# Patient Record
Sex: Male | Born: 1975 | Race: Black or African American | Hispanic: No | Marital: Single | Smoking: Never smoker
Health system: Southern US, Community
[De-identification: ages and names within clinical notes are randomized; demographics above are authoritative.]

---

## 2013-12-31 ENCOUNTER — Encounter (HOSPITAL_COMMUNITY): Payer: Self-pay | Admitting: Emergency Medicine

## 2013-12-31 ENCOUNTER — Emergency Department (HOSPITAL_COMMUNITY)
Admission: EM | Admit: 2013-12-31 | Discharge: 2013-12-31 | Disposition: A | Discharge status: Home / Self Care | Payer: Self-pay | Attending: Emergency Medicine | Admitting: Emergency Medicine

## 2013-12-31 DIAGNOSIS — J309 Allergic rhinitis, unspecified: Secondary | ICD-10-CM | POA: Insufficient documentation

## 2013-12-31 DIAGNOSIS — T4995XA Adverse effect of unspecified topical agent, initial encounter: Secondary | ICD-10-CM | POA: Insufficient documentation

## 2013-12-31 DIAGNOSIS — T7840XA Allergy, unspecified, initial encounter: Secondary | ICD-10-CM

## 2013-12-31 DIAGNOSIS — L500 Allergic urticaria: Secondary | ICD-10-CM | POA: Insufficient documentation

## 2013-12-31 MED ORDER — FAMOTIDINE 20 MG PO TABS: 20.0000 mg | ORAL_TABLET | Freq: Two times a day (BID) | ORAL | Status: DC

## 2013-12-31 MED ORDER — PREDNISONE 20 MG PO TABS
60.0000 mg | ORAL_TABLET | Freq: Once | ORAL | Status: AC
Start: 2013-12-31 — End: 2013-12-31
  Administered 2013-12-31: 60 mg via ORAL
  Filled 2013-12-31: qty 3

## 2013-12-31 MED ORDER — DIPHENHYDRAMINE HCL 25 MG PO TABS: 25.0000 mg | ORAL_TABLET | Freq: Four times a day (QID) | ORAL | Status: DC

## 2013-12-31 NOTE — ED Provider Notes (Signed)
  Medical screening examination/treatment/procedure(s) were performed by non-physician practitioner and as supervising physician I was immediately available for consultation/collaboration.   EKG Interpretation None         Ladarren Steiner, MD 12/31/13 1643 

## 2013-12-31 NOTE — Discharge Planning (Signed)
[facility] Liaison was not able to see the patient, [facility] orange card information and resources will be mailed to the address listed. ° °

## 2013-12-31 NOTE — Discharge Instructions (Signed)
Your rash/hives and lip swelling may be related to allergic reaction to the new soap or even ox tail that you ate recently.  Avoid ox tail and switch back to your old soap.  Take benadryl and pepcid as prescribed.  Follow instruction below.    Allergies Allergies may happen from anything your body is sensitive to. This may be food, medicines, pollens, chemicals, and nearly anything around you in everyday life that produces allergens. An allergen is anything that causes an allergy producing substance. Heredity is often a factor in causing these problems. This means you may have some of the same allergies as your parents. Food allergies happen in all age groups. Food allergies are some of the most severe and life threatening. Some common food allergies are cow's milk, seafood, eggs, nuts, wheat, and soybeans. SYMPTOMS   Swelling around the mouth.  An itchy red rash or hives.  Vomiting or diarrhea.  Difficulty breathing. SEVERE ALLERGIC REACTIONS ARE LIFE-THREATENING. This reaction is called anaphylaxis. It can cause the mouth and throat to swell and cause difficulty with breathing and swallowing. In severe reactions only a trace amount of food (for example, peanut oil in a salad) may cause death within seconds. Seasonal allergies occur in all age groups. These are seasonal because they usually occur during the same season every year. They may be a reaction to molds, grass pollens, or tree pollens. Other causes of problems are house dust mite allergens, pet dander, and mold spores. The symptoms often consist of nasal congestion, a runny itchy nose associated with sneezing, and tearing itchy eyes. There is often an associated itching of the mouth and ears. The problems happen when you come in contact with pollens and other allergens. Allergens are the particles in the air that the body reacts to with an allergic reaction. This causes you to release allergic antibodies. Through a chain of events, these  eventually cause you to release histamine into the blood stream. Although it is meant to be protective to the body, it is this release that causes your discomfort. This is why you were given anti-histamines to feel better. If you are unable to pinpoint the offending allergen, it may be determined by skin or blood testing. Allergies cannot be cured but can be controlled with medicine. Hay fever is a collection of all or some of the seasonal allergy problems. It may often be treated with simple over-the-counter medicine such as diphenhydramine. Take medicine as directed. Do not drink alcohol or drive while taking this medicine. Check with your caregiver or package insert for child dosages. If these medicines are not effective, there are many new medicines your caregiver can prescribe. Stronger medicine such as nasal spray, eye drops, and corticosteroids may be used if the first things you try do not work well. Other treatments such as immunotherapy or desensitizing injections can be used if all else fails. Follow up with your caregiver if problems continue. These seasonal allergies are usually not life threatening. They are generally more of a nuisance that can often be handled using medicine. HOME CARE INSTRUCTIONS   If unsure what causes a reaction, keep a diary of foods eaten and symptoms that follow. Avoid foods that cause reactions.  If hives or rash are present:  Take medicine as directed.  You may use an over-the-counter antihistamine (diphenhydramine) for hives and itching as needed.  Apply cold compresses (cloths) to the skin or take baths in cool water. Avoid hot baths or showers. Heat will make a  rash and itching worse.  If you are severely allergic:  Following a treatment for a severe reaction, hospitalization is often required for closer follow-up.  Wear a medic-alert bracelet or necklace stating the allergy.  You and your family must learn how to give adrenaline or use an  anaphylaxis kit.  If you have had a severe reaction, always carry your anaphylaxis kit or EpiPen with you. Use this medicine as directed by your caregiver if a severe reaction is occurring. Failure to do so could have a fatal outcome. SEEK MEDICAL CARE IF:  You suspect a food allergy. Symptoms generally happen within 30 minutes of eating a food.  Your symptoms have not gone away within 2 days or are getting worse.  You develop new symptoms.  You want to retest yourself or your child with a food or drink you think causes an allergic reaction. Never do this if an anaphylactic reaction to that food or drink has happened before. Only do this under the care of a caregiver. SEEK IMMEDIATE MEDICAL CARE IF:   You have difficulty breathing, are wheezing, or have a tight feeling in your chest or throat.  You have a swollen mouth, or you have hives, swelling, or itching all over your body.  You have had a severe reaction that has responded to your anaphylaxis kit or an EpiPen. These reactions may return when the medicine has worn off. These reactions should be considered life threatening. MAKE SURE YOU:   Understand these instructions.  Will watch your condition.  Will get help right away if you are not doing well or get worse. Document Released: 10/24/2002 Document Revised: 11/25/2012 Document Reviewed: 03/30/2008 [facility] Patient Information 2014 [place].

## 2013-12-31 NOTE — ED Notes (Signed)
Pt is here with lip swelling, no airway problems, hives over body.  Pt states lips started swelling last nite.  Hives been there for three days.  No bp medications

## 2013-12-31 NOTE — ED Provider Notes (Signed)
CSN: [id number]     Arrival date & time 12/31/13  [id number] History   First MD Initiated Contact with Patient 12/31/13 1002    This chart was scribed for Fayrene HelperBowie Magalie Almon PA-C, a non-physician practitioner working with Gerhard Munchobert Lockwood, MD by Lewanda RifeAlexandra Hurtado, ED Scribe. This patient was seen in room TR08C/TR08C and the patient's care was started at 10:03 AM     Chief Complaint  Patient presents with  . Oral Swelling  . Rash    hives     (Consider location/radiation/quality/duration/timing/severity/associated sxs/prior Treatment) The history is provided by the patient. No language interpreter was used.   HPI Comments: Genelle BalRicky Forcier is a 38 y.o. male who presents to the Emergency Department with PMHx of seasonal allergies complaining of intermittent upper lip with alternating lower lip swelling onset last week. Describes symptoms as mild in severity. Reports trying Ox-tail last week for the first time. Reports also changing soap bar last week. Reports associated diffuse pruritic rash on upper torso. Denies trying any antihistamines or any other medications. Denies any aggravating factors. Denies changing soaps, detergent, new pets, new medications, wheeze, tongue swelling, sore throat, difficulty swallowing, and difficulty breathing. History reviewed. No pertinent past medical history. History reviewed. No pertinent past surgical history. No family history on file. History  Substance Use Topics  . Smoking status: Never Smoker   . Smokeless tobacco: Not on file  . Alcohol Use: No    Review of Systems  Constitutional: Negative for fever.  HENT: Negative for sore throat and trouble swallowing.   Respiratory: Negative for shortness of breath.   Skin: Positive for rash.  Allergic/Immunologic: Positive for environmental allergies.      Allergies  Other  Home Medications   Prior to Admission medications   Not on File   BP 118/77  Pulse 82  Temp(Src) 98.3 F (36.8 C) (Oral)  Resp 20   Wt 191 lb 5 oz (86.779 kg)  SpO2 100% Physical Exam  Nursing note and vitals reviewed. Constitutional: He is oriented to person, place, and time. He appears well-developed and well-nourished. No distress.  HENT:  Head: Normocephalic and atraumatic.  Mouth/Throat: Uvula is midline, oropharynx is clear and moist and mucous membranes are normal. No trismus in the jaw. No uvula swelling. No oropharyngeal exudate, posterior oropharyngeal edema or posterior oropharyngeal erythema.      Eyes: EOM are normal.  Neck: Neck supple. No tracheal deviation present.  Cardiovascular: Normal rate.   Pulmonary/Chest: Effort normal and breath sounds normal. No respiratory distress. He has no wheezes. He has no rales.  Musculoskeletal: Normal range of motion.  Neurological: He is alert and oriented to person, place, and time.  Skin: Skin is warm and dry. Rash noted. Rash is urticarial.  Diffuse urticarial rash involving trunk, bilateral arms, and neck area. No drainage. Appears non-infected.   Psychiatric: He has a normal mood and affect. His behavior is normal.    ED Course  Procedures (including critical care time) COORDINATION OF CARE:  Nursing notes reviewed. Vital signs reviewed. Initial pt interview and examination performed.   Filed Vitals:   12/31/13 0957  BP: 118/77  Pulse: 82  Temp: 98.3 F (36.8 C)  TempSrc: Oral  Resp: 20  Weight: 191 lb 5 oz (86.779 kg)  SpO2: 100%    10:06 AM-Discussed work up plan with pt at bedside, which includes one time dose of prednisone along with benadryl/pepcid.  Allergic reaction likely from new soap.  No airway compromise and no other concerning finding.  Pt agrees with plan.   Treatment plan initiated: Medications  predniSONE (DELTASONE) tablet 60 mg (not administered)     Initial diagnostic testing ordered.      Labs Review Labs Reviewed - No data to display  Imaging Review No results found.   EKG Interpretation None      MDM    Final diagnoses:  Allergic reaction    BP 118/77  Pulse 82  Temp(Src) 98.3 F (36.8 C) (Oral)  Resp 20  Wt 191 lb 5 oz (86.779 kg)  SpO2 100%  I personally performed the services described in this documentation, which was scribed in my presence. The recorded information has been reviewed and is accurate.     Fayrene HelperBowie Elener Custodio, PA-C 12/31/13 1014

## 2014-05-21 ENCOUNTER — Emergency Department (HOSPITAL_COMMUNITY)
Admission: EM | Admit: 2014-05-21 | Discharge: 2014-05-21 | Disposition: A | Discharge status: Home / Self Care | Payer: Self-pay | Attending: Emergency Medicine | Admitting: Emergency Medicine

## 2014-05-21 ENCOUNTER — Encounter (HOSPITAL_COMMUNITY): Payer: Self-pay | Admitting: Emergency Medicine

## 2014-05-21 DIAGNOSIS — Z79899 Other long term (current) drug therapy: Secondary | ICD-10-CM | POA: Insufficient documentation

## 2014-05-21 DIAGNOSIS — R220 Localized swelling, mass and lump, head: Secondary | ICD-10-CM | POA: Insufficient documentation

## 2014-05-21 DIAGNOSIS — Z7952 Long term (current) use of systemic steroids: Secondary | ICD-10-CM | POA: Insufficient documentation

## 2014-05-21 MED ORDER — PREDNISONE 20 MG PO TABS: 40.0000 mg | ORAL_TABLET | Freq: Every day | ORAL | Status: AC

## 2014-05-21 MED ORDER — DIPHENHYDRAMINE HCL 25 MG PO CAPS
25.0000 mg | ORAL_CAPSULE | Freq: Once | ORAL | Status: AC
  Administered 2014-05-21: 25 mg via ORAL
  Filled 2014-05-21: qty 1

## 2014-05-21 MED ORDER — PREDNISONE 20 MG PO TABS
60.0000 mg | ORAL_TABLET | Freq: Once | ORAL | Status: AC
  Administered 2014-05-21: 60 mg via ORAL
  Filled 2014-05-21: qty 3

## 2014-05-21 MED ORDER — DIPHENHYDRAMINE HCL 25 MG PO TABS
25.0000 mg | ORAL_TABLET | ORAL | Status: DC | PRN
Start: 2014-05-21 — End: 2020-02-19

## 2014-05-21 NOTE — Discharge Instructions (Signed)
Angioedema °Angioedema is a sudden swelling of tissues, often of the skin. It can occur on the face or genitals or in the abdomen or other body parts. The swelling usually develops over a short period and gets better in 24 to 48 hours. It often begins during the night and is found when the person wakes up. The person may also get red, itchy patches of skin (hives). Angioedema can be dangerous if it involves swelling of the air passages.  °Depending on the cause, episodes of angioedema may only happen once, come back in unpredictable patterns, or repeat for several years and then gradually fade away.  °CAUSES  °Angioedema can be caused by an allergic reaction to various triggers. It can also result from nonallergic causes, including reactions to drugs, immune system disorders, viral infections, or an abnormal gene that is passed to you from your parents (hereditary). For some people with angioedema, the cause is unknown.  °Some things that can trigger angioedema include:  °· Foods.   °· Medicines, such as ACE inhibitors, ARBs, nonsteroidal anti-inflammatory agents, or estrogen.   °· Latex.   °· Animal saliva.   °· Insect stings.   °· Dyes used in X-rays.   °· Mild injury.   °· Dental work. °· Surgery. °· Stress.   °· Sudden changes in temperature.   °· Exercise. °SIGNS AND SYMPTOMS  °· Swelling of the skin. °· Hives. If these are present, there is also intense itching. °· Redness in the affected area.   °· Pain in the affected area. °· Swollen lips or tongue. °· Breathing problems. This may happen if the air passages swell. °· Wheezing. °If internal organs are involved, there may be:  °· Nausea.   °· Abdominal pain.   °· Vomiting.   °· Difficulty swallowing.   °· Difficulty passing urine. °DIAGNOSIS  °· Your health care provider will examine the affected area and take a medical and family history. °· Various tests may be done to help determine the cause. Tests may include: °¨ Allergy skin tests to see if the problem  is an allergic reaction.   °¨ Blood tests to check for hereditary angioedema.   °¨ Tests to check for underlying diseases that could cause the condition.   °· A review of your medicines, including over-the-counter medicines, may be done. °TREATMENT  °Treatment will depend on the cause of the angioedema. Possible treatments include:  °· Removal of anything that triggered the condition (such as stopping certain medicines).   °· Medicines to treat symptoms or prevent attacks. Medicines given may include:   °¨ Antihistamines.   °¨ Epinephrine injection.   °¨ Steroids.   °· Hospitalization may be required for severe attacks. If the air passages are affected, it can be an emergency. Tubes may need to be placed to keep the airway open. °HOME CARE INSTRUCTIONS  °· Take all medicines as directed by your health care provider. °· If you were given medicines for emergency allergy treatment, always carry them with you. °· Wear a medical bracelet as directed by your health care provider.   °· Avoid known triggers. °SEEK MEDICAL CARE IF:  °· You have repeat attacks of angioedema.   °· Your attacks are more frequent or more severe despite preventive measures.   °· You have hereditary angioedema and are considering having children. It is important to discuss with your health care provider the risks of passing the condition on to your children. °SEEK IMMEDIATE MEDICAL CARE IF:  °· You have severe swelling of the mouth, tongue, or lips. °· You have difficulty breathing.   °· You have difficulty swallowing.   °· You faint. °MAKE   SURE YOU: °· Understand these instructions. °· Will watch your condition. °· Will get help right away if you are not doing well or get worse. °Document Released: 10/09/2001 Document Revised: 12/15/2013 Document Reviewed: 03/24/2013 °[employer]® Patient Information ©2015 [employer]. This information is not intended to replace advice given to you by your health care provider. Make sure you discuss any questions  you have with your health care provider. ° °

## 2014-05-21 NOTE — ED Provider Notes (Signed)
CSN: [id number]     Arrival date & time 05/21/14  1653 History   First MD Initiated Contact with Patient 05/21/14 1712     Chief Complaint  Patient presents with  . Oral Swelling     (Consider location/radiation/quality/duration/timing/severity/associated sxs/prior Treatment) HPI 38 year old male presents with upper lip swelling since last night. He states that 2 nights ago he ate chicken and noticed that after a while he had right-sided tongue swelling and trouble talking. This seemed to spontaneously resolve yesterday morning. Last night he had lasagna and shortly after noticed upper lip swelling. The upper lip swelling has continued into today from us 24 hours. It is mildly improved. He has not noticed any rash. No tongue swelling, trouble speaking, trouble swallowing, or shortness of breath. He states he is also smokes marijuana every night and was not different is a nice. No new medicines or detergents. He did have a new toothpaste that started 2 weeks ago. His not on any antihypertensives. Lower lip is normal.  History reviewed. No pertinent past medical history. History reviewed. No pertinent past surgical history. No family history on file. History  Substance Use Topics  . Smoking status: Never Smoker   . Smokeless tobacco: Not on file  . Alcohol Use: No    Review of Systems  HENT: Positive for facial swelling. Negative for sore throat and trouble swallowing.   Respiratory: Negative for cough, shortness of breath and stridor.   Gastrointestinal: Negative for vomiting.  All other systems reviewed and are negative.     Allergies  Other  Home Medications   Prior to Admission medications   Medication Sig Start Date End Date Taking? Authorizing Provider  diphenhydrAMINE (BENADRYL) 25 MG tablet Take 1 tablet (25 mg total) by mouth every 6 (six) hours. 12/31/13   Fayrene HelperBowie Tran, PA-C  diphenhydrAMINE (BENADRYL) 25 MG tablet Take 1-2 tablets (25-50 mg total) by mouth every 4 (four)  hours as needed for itching or allergies. 05/21/14   Audree CamelScott T Maryem Shuffler, MD  famotidine (PEPCID) 20 MG tablet Take 1 tablet (20 mg total) by mouth 2 (two) times daily. 12/31/13   Fayrene HelperBowie Tran, PA-C  predniSONE (DELTASONE) 20 MG tablet Take 2 tablets (40 mg total) by mouth daily. 05/22/14 05/25/14  Audree CamelScott T Neria Procter, MD   BP 113/77  Pulse 63  Temp(Src) 99.1 F (37.3 C) (Oral)  Resp 18  SpO2 97% Physical Exam  Nursing note and vitals reviewed. Constitutional: He is oriented to person, place, and time. He appears well-developed and well-nourished.  HENT:  Head: Normocephalic and atraumatic.  Right Ear: External ear normal.  Left Ear: External ear normal.  Nose: Nose normal.  Mouth/Throat: Oropharynx is clear and moist.  Diffuse, symmetric upper lip swelling, lower lip normal. Tongue and oropharynx normal  Eyes: Right eye exhibits no discharge. Left eye exhibits no discharge.  Neck: Neck supple.  Cardiovascular: Normal rate, regular rhythm, normal heart sounds and intact distal pulses.   Pulmonary/Chest: Effort normal and breath sounds normal. No stridor. He has no wheezes. He has no rales.  Abdominal: He exhibits no distension.  Neurological: He is alert and oriented to person, place, and time.  Skin: Skin is warm and dry.    ED Course  Procedures (including critical care time) Labs Review Labs Reviewed - No data to display  Imaging Review No results found.   EKG Interpretation None      MDM   Final diagnoses:  Swelling of upper lip    Patient with upper  lip swelling of unclear etiology. No respiratory symptoms. He did have significant tongue swelling yesterday but has none now. It is difficult to tell what the trigger is. No antihypertensives. At this point is well-appearing in his lip swelling is improved throughout the day and has been over 12-24 hours since this started. I feel he can be treated symptomatically with Benadryl and steroids, although this likely has limited  affect. I did discuss that if he does have repeat tongue swelling he is to come back to the ER immediately. He understands this and will recommend he followup with an allergist for further testing.    Audree Camel, MD 05/21/14 [phone]

## 2014-05-21 NOTE — ED Notes (Addendum)
Reports Tuesday night ate chicken and his tongue became swollen. Last night ate lasanga and his upper lip was swollen. Upper lip appears swollen. No respiratory distress. Is a x 4. Reports this happened before and he changed laundry detergent. Reports smoked marijuana each time before this occurred.

## 2018-01-18 ENCOUNTER — Other Ambulatory Visit: Payer: Self-pay

## 2018-01-18 ENCOUNTER — Emergency Department (HOSPITAL_BASED_OUTPATIENT_CLINIC_OR_DEPARTMENT_OTHER)
Admission: EM | Admit: 2018-01-18 | Discharge: 2018-01-18 | Disposition: A | Discharge status: Home / Self Care | Payer: Self-pay | Attending: Emergency Medicine | Admitting: Emergency Medicine

## 2018-01-18 ENCOUNTER — Encounter (HOSPITAL_BASED_OUTPATIENT_CLINIC_OR_DEPARTMENT_OTHER): Payer: Self-pay | Admitting: *Deleted

## 2018-01-18 DIAGNOSIS — M79604 Pain in right leg: Secondary | ICD-10-CM | POA: Insufficient documentation

## 2018-01-18 DIAGNOSIS — Z79899 Other long term (current) drug therapy: Secondary | ICD-10-CM | POA: Insufficient documentation

## 2018-01-18 DIAGNOSIS — M62830 Muscle spasm of back: Secondary | ICD-10-CM | POA: Insufficient documentation

## 2018-01-18 MED ORDER — METHOCARBAMOL 500 MG PO TABS: 500.0000 mg | ORAL_TABLET | Freq: Two times a day (BID) | ORAL | 0 refills | Status: DC

## 2018-01-18 NOTE — ED Provider Notes (Signed)
[facility] EMERGENCY DEPARTMENT Provider Note   CSN: [id number] Arrival date & time: 01/18/18  1436     History   Chief Complaint Chief Complaint  Patient presents with  . Back Pain    HPI Jeffrey Cherry is a 42 y.o. male.  HPI   Patient is a 42 year old male who presents the emergency department today complaining of right lower back pain that has been present for the last 2 days.  Pain radiates down the back of his right leg.  Rates pain 8/10.  Is constant in nature and is worse with movement.  He has not tried any interventions for his symptoms.  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  He denies any abdominal pain nausea or vomiting.  No urinary symptoms.  Pt denies any numbness/tingling/weakness to the BLE. Denies saddle anesthesia. Denies loss of control of bowels or bladder. No urinary retention. No fevers. Denies a h/o IVDU. Denies a h/o CA or recent unintended weight loss.  History reviewed. No pertinent past medical history.  There are no active problems to display for this patient.   History reviewed. No pertinent surgical history.      Home Medications    Prior to Admission medications   Medication Sig Start Date End Date Taking? Authorizing Provider  diphenhydrAMINE (BENADRYL) 25 MG tablet Take 1 tablet (25 mg total) by mouth every 6 (six) hours. 12/31/13   Fayrene Helperran, Bowie, PA-C  diphenhydrAMINE (BENADRYL) 25 MG tablet Take 1-2 tablets (25-50 mg total) by mouth every 4 (four) hours as needed for itching or allergies. 05/21/14   Pricilla LovelessGoldston, Scott, MD  famotidine (PEPCID) 20 MG tablet Take 1 tablet (20 mg total) by mouth 2 (two) times daily. 12/31/13   Fayrene Helperran, Bowie, PA-C  methocarbamol (ROBAXIN) 500 MG tablet Take 1 tablet (500 mg total) by mouth 2 (two) times daily. 01/18/18   Sera Hitsman S, PA-C    Family History History reviewed. No pertinent family history.  Social History Social History    Tobacco Use  . Smoking status: Never Smoker  . Smokeless tobacco: Never Used  Substance Use Topics  . Alcohol use: No    Comment: soc  . Drug use: Yes    Types: Marijuana     Allergies   Other   Review of Systems Review of Systems  Constitutional: Negative for fever.  HENT: Negative for congestion.   Respiratory: Negative for shortness of breath.   Cardiovascular: Negative for chest pain.  Gastrointestinal: Negative for abdominal pain, blood in stool, constipation, diarrhea, nausea and vomiting.  Genitourinary: Negative for flank pain, hematuria and urgency.  Musculoskeletal: Positive for back pain.  Skin: Negative for wound.  Neurological: Negative for weakness, numbness and headaches.   Physical Exam Updated Vital Signs BP 122/80   Pulse 66   Temp 97.7 F (36.5 C)   Resp 14   Ht 6' (1.829 m)   Wt 81.6 kg (180 lb)   SpO2 100%   BMI 24.41 kg/m   Physical Exam  Constitutional: He is oriented to person, place, and time. He appears well-developed and well-nourished. No distress.  HENT:  Head: Normocephalic and atraumatic.  Eyes: Conjunctivae are normal.  Neck: Neck supple.  Cardiovascular: Normal rate, regular rhythm, normal heart sounds and intact distal pulses.  Pulmonary/Chest: Effort normal and breath sounds normal. He has no wheezes.  Abdominal: Soft. Bowel sounds are normal.  No CVA ttp  Musculoskeletal:  No  midline TTP. TTP to the paraspinous muscles along the right side of the lumbar spine that reproduces his pain, muscle spasm present  Neurological: He is alert and oriented to person, place, and time.  Normal tone. 5/5 strength of BUE and BLE major muscle groups including strong and equal grip strength and dorsiflexion/plantar flexion Sensory: light touch normal in all extremities. DTRs: patellar 2+ symmetric b/l Gait: normal gait and balance. CV: 2+ radial and DP/PT pulses   Skin: Skin is warm and dry.   ED Treatments / Results  Labs (all labs  ordered are listed, but only abnormal results are displayed) Labs Reviewed - No data to display  EKG None  Radiology No results found.  Procedures Procedures (including critical care time)  Medications Ordered in ED Medications - No data to display   Initial Impression / Assessment and Plan / ED Course  I have reviewed the triage vital signs and the nursing notes.  Pertinent labs & imaging results that were available during my care of the patient were reviewed by me and considered in my medical decision making (see chart for details).    Final Clinical Impressions(s) / ED Diagnoses   Final diagnoses:  Muscle spasm of back   Patient with back pain. sxs consistent with muscle spasm to paraspinous muscles along lumbar spine. No neurological deficits and normal neuro exam.  Patient can walk but states is painful.  No loss of bowel or bladder control.  No concern for cauda equina.  No fever, night sweats, weight loss, h/o cancer, IVDU.  RICE protocol and pain medicine indicated and discussed with patient. rx for muscle relaxer given. Return precautions discussed. Pt voices understanding  ED Discharge Orders        Ordered    methocarbamol (ROBAXIN) 500 MG tablet  2 times daily     01/18/18 1517       Monifa Blanchette S, PA-C 01/18/18 1518    Tilden Fossa, MD 01/19/18 [phone]

## 2018-01-18 NOTE — ED Notes (Signed)
Pt requesting work note for the past 2 days he missed

## 2018-01-18 NOTE — ED Triage Notes (Signed)
Pt c/o right lower back pain which radiates down right leg x 2 days

## 2018-01-18 NOTE — Discharge Instructions (Signed)

## 2018-07-12 ENCOUNTER — Emergency Department (HOSPITAL_BASED_OUTPATIENT_CLINIC_OR_DEPARTMENT_OTHER)
Admission: EM | Admit: 2018-07-12 | Discharge: 2018-07-12 | Disposition: A | Discharge status: Home / Self Care | Payer: Self-pay | Attending: Emergency Medicine | Admitting: Emergency Medicine

## 2018-07-12 ENCOUNTER — Emergency Department (HOSPITAL_BASED_OUTPATIENT_CLINIC_OR_DEPARTMENT_OTHER): Payer: Self-pay

## 2018-07-12 ENCOUNTER — Other Ambulatory Visit: Payer: Self-pay

## 2018-07-12 ENCOUNTER — Encounter (HOSPITAL_BASED_OUTPATIENT_CLINIC_OR_DEPARTMENT_OTHER): Payer: Self-pay | Admitting: Emergency Medicine

## 2018-07-12 DIAGNOSIS — Z79899 Other long term (current) drug therapy: Secondary | ICD-10-CM | POA: Insufficient documentation

## 2018-07-12 DIAGNOSIS — M25552 Pain in left hip: Secondary | ICD-10-CM | POA: Insufficient documentation

## 2018-07-12 DIAGNOSIS — G8929 Other chronic pain: Secondary | ICD-10-CM | POA: Insufficient documentation

## 2018-07-12 MED ORDER — METHOCARBAMOL 500 MG PO TABS: 500.0000 mg | ORAL_TABLET | Freq: Three times a day (TID) | ORAL | 0 refills | Status: DC | PRN

## 2018-07-12 MED ORDER — NAPROXEN 500 MG PO TABS: 500.0000 mg | ORAL_TABLET | Freq: Two times a day (BID) | ORAL | 0 refills | Status: DC

## 2018-07-12 NOTE — Discharge Instructions (Signed)
You were seen in the emergency department today for hip pain.  Your x-ray showed some degenerative changes, somewhat like arthritis, and the hips on both sides and in the lower back.  Send you home with medications to help with your symptoms:  - Naproxen is a nonsteroidal anti-inflammatory medication that will help with pain and swelling. Be sure to take this medication as prescribed with food, 1 pill every 12 hours,  It should be taken with food, as it can cause stomach upset, and more seriously, stomach bleeding. Do not take other nonsteroidal anti-inflammatory medications with this such as Advil, Motrin, Aleve, Mobic, Goodie Powder, or Motrin.    - Robaxin is the muscle relaxer I have prescribed, this is meant to help with muscle tightness. Be aware that this medication may make you drowsy therefore the first time you take this it should be at a time you are in an environment where you can rest. Do not drive or operate heavy machinery when taking this medication. Do not drink alcohol or take other sedating medications with this medicine such as narcotics or benzodiazepines.   You make take Tylenol per over the counter dosing with these medications.   We have prescribed you new medication(s) today. Discuss the medications prescribed today with your pharmacist as they can have adverse effects and interactions with your other medicines including over the counter and prescribed medications. Seek medical evaluation if you start to experience new or abnormal symptoms after taking one of these medicines, seek care immediately if you start to experience difficulty breathing, feeling of your throat closing, facial swelling, or rash as these could be indications of a more serious allergic reaction    Please follow-up with the sports medicine doctor upstairs, Dr. Pearletha ForgeHudnall, for further evaluation and management.  Please follow-up within a week.  Return to the ER for new or worsening symptoms including but not  limited to fever, worsening pain, numbness, weakness, loss of control of bowel or bladder function, numbness to the groin, or any other concerns.

## 2018-07-12 NOTE — ED Triage Notes (Signed)
L hip pain since yesterday. Denies injury.

## 2018-07-12 NOTE — ED Provider Notes (Signed)
[facility] EMERGENCY DEPARTMENT Provider Note   CSN: [id number] Arrival date & time: 07/12/18  1334     History   Chief Complaint Chief Complaint  Patient presents with  . Hip Pain    HPI Jeffrey Cherry is a 42 y.o. male without significant who presents to the ED with chronic L hip pain intermittently for the past few years. Pain seems to occur more after physical activity/heavy lifting. Pain is located in the L posterior hip/buttocks- will occasionally radiate down upper portion of LLE, it is worse with certain movements/positions. Has tried unknown OTC medication in the past without much change. No specific traumatic injuries. He has had intermittent pain to the R hip that is similar, but not occurring at present. He is requesting xrays and is asking if this pain could be arthritis. Denies numbness, tingling, weakness, saddle anesthesia, incontinence to bowel/bladder, fever, chills, IV drug use, dysuria, or hx of cancer. Patient has not had prior back/hip surgeries.   HPI  History reviewed. No pertinent past medical history.  There are no active problems to display for this patient.   History reviewed. No pertinent surgical history.      Home Medications    Prior to Admission medications   Medication Sig Start Date End Date Taking? Authorizing Provider  diphenhydrAMINE (BENADRYL) 25 MG tablet Take 1 tablet (25 mg total) by mouth every 6 (six) hours. 12/31/13   Fayrene Helper, PA-C  diphenhydrAMINE (BENADRYL) 25 MG tablet Take 1-2 tablets (25-50 mg total) by mouth every 4 (four) hours as needed for itching or allergies. 05/21/14   Pricilla Loveless, MD  famotidine (PEPCID) 20 MG tablet Take 1 tablet (20 mg total) by mouth 2 (two) times daily. 12/31/13   Fayrene Helper, PA-C  methocarbamol (ROBAXIN) 500 MG tablet Take 1 tablet (500 mg total) by mouth 2 (two) times daily. 01/18/18   Couture, Cortni S, PA-C    Family History No family history on file.  Social  History Social History   Tobacco Use  . Smoking status: Never Smoker  . Smokeless tobacco: Never Used  Substance Use Topics  . Alcohol use: No    Comment: soc  . Drug use: Yes    Types: Marijuana     Allergies   Other   Review of Systems Review of Systems  Constitutional: Negative for chills and fever.  Cardiovascular: Negative for leg swelling.  Musculoskeletal: Positive for arthralgias (L hip). Negative for joint swelling.  Skin: Negative for color change, pallor, rash and wound.  Neurological: Negative for weakness and numbness.       Negative for incontinence or saddle anesthesia.    Physical Exam Updated Vital Signs BP 107/79 (BP Location: Left Arm)   Pulse 93   Temp 97.8 F (36.6 C) (Oral)   Resp 16   Ht 6' (1.829 m)   Wt 86.2 kg   SpO2 99%   BMI 25.77 kg/m   Physical Exam  Constitutional: He appears well-developed and well-nourished. No distress.  HENT:  Head: Normocephalic and atraumatic.  Eyes: Conjunctivae are normal. Right eye exhibits no discharge. Left eye exhibits no discharge.  Cardiovascular:  Pulses:      Dorsalis pedis pulses are 2+ on the right side, and 2+ on the left side.       Posterior tibial pulses are 2+ on the right side, and 2+ on the left side.  Musculoskeletal:  No obvious deformity, appreciable swelling, erythema, ecchymosis, warmth, or open wounds.  Back: No midline vertebral  tenderness to palpation. No palpable step off. L lumbar paraspinal muscle tenderness to palpation which extends to the L gluteal area.  Lower extremities: Full AROM to bilateral hips, knees, ankles, and all digits. No point/focal bony tenderness to lower extremities including the L hip. Some discomfort in the L hip with fabers.   Neurological: He is alert.  Clear speech. Sensation grossly intact to bilateral lower extremities. 5/5 strength with hip flexion/extension/abducion/adduction, knee flexion/extension, and ankle plantar/dorsiflexion. Patellar DTRs are  2+ and symmetric. Ambulatory with steady gait. No foot drop noted.   Psychiatric: He has a normal mood and affect. His behavior is normal. Thought content normal.  Nursing note and vitals reviewed.    ED Treatments / Results  Labs (all labs ordered are listed, but only abnormal results are displayed) Labs Reviewed - No data to display  EKG None  Radiology Dg Hip Unilat With Pelvis 2-3 Views Left  Result Date: 07/12/2018 CLINICAL DATA:  Chronic left hip pain. EXAM: DG HIP (WITH OR WITHOUT PELVIS) 2-3V LEFT COMPARISON:  No recent. FINDINGS: Degenerative change lumbar spine and both hips. No acute bony abnormality identified. No evidence of fracture dislocation. Pelvic calcifications consistent with phleboliths. IMPRESSION: Degenerative changes lumbar spine and both hips. No acute abnormality. Electronically Signed   By: Maisie Fushomas  Register   On: 07/12/2018 14:38    Procedures Procedures (including critical care time)  Medications Ordered in ED Medications - No data to display   Initial Impression / Assessment and Plan / ED Course  I have reviewed the triage vital signs and the nursing notes.  Pertinent labs & imaging results that were available during my care of the patient were reviewed by me and considered in my medical decision making (see chart for details).   Patient presents to the ED with chronic intermittent L hip pain, has hx of similar on the R hip as well- but no pain to the R hip at present. Patient is nontoxic appearing, vitals are WNL. Patient has normal neurologic exam, no point/focal midline vertebral tenderness to palpation. He is ambulatory in the ED.  No back pain red flags. No urinary sxs. Given patient concern xray was ordered- revealed degenerative changes to bilateral hips and the lumbar region- likely playing a factor, also likely muscle strain/spasm. Considered disc disease, UTI/pyelonephritis, kidney stone, aortic aneurysm/dissection, cauda equina or epidural  abscess however these do not fit clinical picture at this time. Will treat with Naproxen and Robaxin, discussed with patient that they are not to drive or operate heavy machinery while taking Robaxin. I discussed treatment plan, need for PCP/sports medicine follow-up, and return precautions with the patient. Provided opportunity for questions, patient confirmed understanding and is in agreement with plan.   Final Clinical Impressions(s) / ED Diagnoses   Final diagnoses:  Left hip pain    ED Discharge Orders         Ordered    naproxen (NAPROSYN) 500 MG tablet  2 times daily     07/12/18 1446    methocarbamol (ROBAXIN) 500 MG tablet  Every 8 hours PRN     07/12/18 1446           Petrucelli, PottervilleSamantha R, PA-C 07/12/18 1504    Maia PlanLong, Joshua G, MD 07/13/18 1505

## 2018-10-22 ENCOUNTER — Emergency Department (HOSPITAL_BASED_OUTPATIENT_CLINIC_OR_DEPARTMENT_OTHER)
Admission: EM | Admit: 2018-10-22 | Discharge: 2018-10-22 | Disposition: A | Discharge status: Home / Self Care | Payer: Self-pay | Attending: Emergency Medicine | Admitting: Emergency Medicine

## 2018-10-22 ENCOUNTER — Encounter (HOSPITAL_BASED_OUTPATIENT_CLINIC_OR_DEPARTMENT_OTHER): Payer: Self-pay | Admitting: Emergency Medicine

## 2018-10-22 ENCOUNTER — Other Ambulatory Visit: Payer: Self-pay

## 2018-10-22 DIAGNOSIS — R21 Rash and other nonspecific skin eruption: Secondary | ICD-10-CM | POA: Insufficient documentation

## 2018-10-22 DIAGNOSIS — F1210 Cannabis abuse, uncomplicated: Secondary | ICD-10-CM | POA: Insufficient documentation

## 2018-10-22 NOTE — ED Provider Notes (Signed)
[facility] EMERGENCY DEPARTMENT Provider Note   CSN: [id number] Arrival date & time: 10/22/18  0806    History   Chief Complaint Chief Complaint  Patient presents with  . rash    HPI Jeffrey Cherry is a 43 y.o. male.  He is here with complaint of facial boils that seem to occur after he he has intercourse with a specific male.  He said it happened for 5 times and usually occurs the day after he is with her.  He says he had intercourse with other people this never happened.  He has it at her house but he says it is very clean there.  She does not have any symptoms.  He denies any other illness symptoms and he has no genitourinary symptoms.  He says his face will break out and the same spots on his nose and behind his left ear.  They come out as boils and break.     The history is provided by the patient.  Rash  Location:  Face Facial rash location:  Nose Quality: draining, itchiness and swelling   Severity:  Moderate Onset quality:  Gradual Duration:  1 day Timing:  Intermittent Progression:  Unchanged Chronicity:  Recurrent Context: not animal contact and not exposure to similar rash   Relieved by:  None tried Worsened by:  Nothing Ineffective treatments:  None tried Associated symptoms: no abdominal pain, no diarrhea, no fever, no headaches, no nausea, no shortness of breath, no sore throat and not wheezing     History reviewed. No pertinent past medical history.  There are no active problems to display for this patient.   History reviewed. No pertinent surgical history.      Home Medications    Prior to Admission medications   Medication Sig Start Date End Date Taking? Authorizing Provider  diphenhydrAMINE (BENADRYL) 25 MG tablet Take 1 tablet (25 mg total) by mouth every 6 (six) hours. 12/31/13   Fayrene Helper, PA-C  diphenhydrAMINE (BENADRYL) 25 MG tablet Take 1-2 tablets (25-50 mg total) by mouth every 4 (four) hours as needed for itching or  allergies. 05/21/14   Pricilla Loveless, MD  famotidine (PEPCID) 20 MG tablet Take 1 tablet (20 mg total) by mouth 2 (two) times daily. 12/31/13   Fayrene Helper, PA-C  methocarbamol (ROBAXIN) 500 MG tablet Take 1 tablet (500 mg total) by mouth every 8 (eight) hours as needed. 07/12/18   Petrucelli, Samantha R, PA-C  naproxen (NAPROSYN) 500 MG tablet Take 1 tablet (500 mg total) by mouth 2 (two) times daily. 07/12/18   Petrucelli, Pleas Koch, PA-C    Family History No family history on file.  Social History Social History   Tobacco Use  . Smoking status: Never Smoker  . Smokeless tobacco: Never Used  Substance Use Topics  . Alcohol use: No    Comment: soc  . Drug use: Yes    Types: Marijuana     Allergies   Other   Review of Systems Review of Systems  Constitutional: Negative for fever.  HENT: Negative for sore throat.   Eyes: Negative for visual disturbance.  Respiratory: Negative for shortness of breath and wheezing.   Cardiovascular: Negative for chest pain.  Gastrointestinal: Negative for abdominal pain, diarrhea and nausea.  Genitourinary: Negative for discharge, dysuria, frequency, genital sores, hematuria, penile pain, penile swelling, scrotal swelling, testicular pain and urgency.  Musculoskeletal: Negative for back pain.  Skin: Positive for rash.  Neurological: Negative for headaches.     Physical  Exam Updated Vital Signs BP 111/83   Pulse 69   Temp 98.4 F (36.9 C) (Oral)   Wt 81.6 kg   SpO2 99%   BMI 24.41 kg/m   Physical Exam Vitals signs and nursing note reviewed.  Constitutional:      Appearance: He is well-developed.  HENT:     Head: Normocephalic and atraumatic.  Eyes:     Conjunctiva/sclera: Conjunctivae normal.  Neck:     Musculoskeletal: Neck supple.  Cardiovascular:     Rate and Rhythm: Normal rate and regular rhythm.  Pulmonary:     Effort: Pulmonary effort is normal.  Musculoskeletal: Normal range of motion.     Right lower leg: No  edema.     Left lower leg: No edema.  Skin:    General: Skin is warm and dry.     Capillary Refill: Capillary refill takes less than 2 seconds.     Comments: Is a small area of erythema over the tip of his nose and few lesions that are small raised around his left ear.  Neurological:     General: No focal deficit present.     Mental Status: He is alert.     GCS: GCS eye subscore is 4. GCS verbal subscore is 5. GCS motor subscore is 6.     Gait: Gait normal.      ED Treatments / Results  Labs (all labs ordered are listed, but only abnormal results are displayed) Labs Reviewed  RPR  HIV ANTIBODY (ROUTINE TESTING W REFLEX)  GC/CHLAMYDIA PROBE AMP ([place]) NOT AT [facility]    EKG None  Radiology No results found.  Procedures Procedures (including critical care time)  Medications Ordered in ED Medications - No data to display   Initial Impression / Assessment and Plan / ED Course  I have reviewed the triage vital signs and the nursing notes.  Pertinent labs & imaging results that were available during my care of the patient were reviewed by me and considered in my medical decision making (see chart for details).    Facial rash in setting of sexual relations with partner. Doubt std, but will test. Probably some kind of contact dermatitis. Recommended topical steroid.      Final Clinical Impressions(s) / ED Diagnoses   Final diagnoses:  Facial rash    ED Discharge Orders    None       Terrilee Files, MD 10/22/18 [phone]

## 2018-10-22 NOTE — Discharge Instructions (Signed)
You were seen in the emergency department for skin lesions on your face that occur after contact with a specific person.  This is likely some sort of contact allergy and you can try hydrocortisone cream topically to the affected areas 3 times a day.  We are testing you for common STDs and we will call you if any of your results were positive and you need further treatment.  Please return if any worsening symptoms.

## 2018-10-22 NOTE — ED Notes (Signed)
Patient verbalizes understanding of discharge instructions. Opportunity for questioning and answers were provided. Armband removed by staff, pt discharged from ED.  

## 2018-10-22 NOTE — ED Triage Notes (Signed)
Pt here after intercourse with certain male and states that "his face breaks out after". Pt has bumps on nose, ear and side of cheek. Denies STD exposure.

## 2018-10-23 LAB — HIV ANTIBODY (ROUTINE TESTING W REFLEX): HIV Screen 4th Generation wRfx: NONREACTIVE

## 2018-10-23 LAB — RPR: RPR Ser Ql: NONREACTIVE

## 2018-10-23 LAB — GC/CHLAMYDIA PROBE AMP (~~LOC~~) NOT AT ARMC
Chlamydia: NEGATIVE
Neisseria Gonorrhea: NEGATIVE

## 2019-12-29 ENCOUNTER — Emergency Department (HOSPITAL_BASED_OUTPATIENT_CLINIC_OR_DEPARTMENT_OTHER): Payer: Self-pay

## 2019-12-29 ENCOUNTER — Encounter (HOSPITAL_BASED_OUTPATIENT_CLINIC_OR_DEPARTMENT_OTHER): Payer: Self-pay

## 2019-12-29 ENCOUNTER — Emergency Department (HOSPITAL_BASED_OUTPATIENT_CLINIC_OR_DEPARTMENT_OTHER)
Admission: EM | Admit: 2019-12-29 | Discharge: 2019-12-29 | Disposition: A | Discharge status: Home / Self Care | Payer: Self-pay | Attending: Emergency Medicine | Admitting: Emergency Medicine

## 2019-12-29 ENCOUNTER — Other Ambulatory Visit: Payer: Self-pay

## 2019-12-29 DIAGNOSIS — R519 Headache, unspecified: Secondary | ICD-10-CM | POA: Insufficient documentation

## 2019-12-29 DIAGNOSIS — S20213A Contusion of bilateral front wall of thorax, initial encounter: Secondary | ICD-10-CM | POA: Insufficient documentation

## 2019-12-29 DIAGNOSIS — Y999 Unspecified external cause status: Secondary | ICD-10-CM | POA: Insufficient documentation

## 2019-12-29 DIAGNOSIS — M545 Low back pain: Secondary | ICD-10-CM | POA: Insufficient documentation

## 2019-12-29 DIAGNOSIS — T07XXXA Unspecified multiple injuries, initial encounter: Secondary | ICD-10-CM

## 2019-12-29 DIAGNOSIS — S199XXA Unspecified injury of neck, initial encounter: Secondary | ICD-10-CM | POA: Insufficient documentation

## 2019-12-29 DIAGNOSIS — Y929 Unspecified place or not applicable: Secondary | ICD-10-CM | POA: Insufficient documentation

## 2019-12-29 DIAGNOSIS — S299XXA Unspecified injury of thorax, initial encounter: Secondary | ICD-10-CM | POA: Insufficient documentation

## 2019-12-29 DIAGNOSIS — S0083XA Contusion of other part of head, initial encounter: Secondary | ICD-10-CM | POA: Insufficient documentation

## 2019-12-29 DIAGNOSIS — Y9389 Activity, other specified: Secondary | ICD-10-CM | POA: Insufficient documentation

## 2019-12-29 DIAGNOSIS — M542 Cervicalgia: Secondary | ICD-10-CM | POA: Insufficient documentation

## 2019-12-29 DIAGNOSIS — S3991XA Unspecified injury of abdomen, initial encounter: Secondary | ICD-10-CM | POA: Insufficient documentation

## 2019-12-29 DIAGNOSIS — S301XXA Contusion of abdominal wall, initial encounter: Secondary | ICD-10-CM | POA: Insufficient documentation

## 2019-12-29 LAB — URINALYSIS, ROUTINE W REFLEX MICROSCOPIC
Bilirubin Urine: NEGATIVE
Glucose, UA: NEGATIVE mg/dL
Ketones, ur: NEGATIVE mg/dL
Leukocytes,Ua: NEGATIVE
Nitrite: NEGATIVE
Protein, ur: NEGATIVE mg/dL
Specific Gravity, Urine: 1.02 (ref 1.005–1.030)
pH: 6 (ref 5.0–8.0)

## 2019-12-29 LAB — URINALYSIS, MICROSCOPIC (REFLEX)

## 2019-12-29 LAB — CBC WITH DIFFERENTIAL/PLATELET
Abs Immature Granulocytes: 0.01 10*3/uL (ref 0.00–0.07)
Basophils Absolute: 0.1 10*3/uL (ref 0.0–0.1)
Basophils Relative: 1 %
Eosinophils Absolute: 0.4 10*3/uL (ref 0.0–0.5)
Eosinophils Relative: 5 %
HCT: 40.1 % (ref 39.0–52.0)
Hemoglobin: 13.2 g/dL (ref 13.0–17.0)
Immature Granulocytes: 0 %
Lymphocytes Relative: 37 %
Lymphs Abs: 2.4 10*3/uL (ref 0.7–4.0)
MCH: 27.3 pg (ref 26.0–34.0)
MCHC: 32.9 g/dL (ref 30.0–36.0)
MCV: 83 fL (ref 80.0–100.0)
Monocytes Absolute: 0.6 10*3/uL (ref 0.1–1.0)
Monocytes Relative: 9 %
Neutro Abs: 3.2 10*3/uL (ref 1.7–7.7)
Neutrophils Relative %: 48 %
Platelets: 275 10*3/uL (ref 150–400)
RBC: 4.83 MIL/uL (ref 4.22–5.81)
RDW: 12.8 % (ref 11.5–15.5)
WBC: 6.6 10*3/uL (ref 4.0–10.5)
nRBC: 0 % (ref 0.0–0.2)

## 2019-12-29 LAB — COMPREHENSIVE METABOLIC PANEL
ALT: 24 U/L (ref 0–44)
AST: 28 U/L (ref 15–41)
Albumin: 4 g/dL (ref 3.5–5.0)
Alkaline Phosphatase: 92 U/L (ref 38–126)
Anion gap: 10 (ref 5–15)
BUN: 10 mg/dL (ref 6–20)
CO2: 26 mmol/L (ref 22–32)
Calcium: 8.7 mg/dL — ABNORMAL LOW (ref 8.9–10.3)
Chloride: 100 mmol/L (ref 98–111)
Creatinine, Ser: 1.1 mg/dL (ref 0.61–1.24)
GFR calc Af Amer: >60 mL/min (ref >60)
GFR calc non Af Amer: >60 mL/min (ref >60)
Glucose, Bld: 87 mg/dL (ref 70–99)
Potassium: 3.6 mmol/L (ref 3.5–5.1)
Sodium: 136 mmol/L (ref 135–145)
Total Bilirubin: 0.9 mg/dL (ref 0.3–1.2)
Total Protein: 7.6 g/dL (ref 6.5–8.1)

## 2019-12-29 MED ORDER — IOHEXOL 300 MG/ML  SOLN
100.0000 mL | Freq: Once | INTRAMUSCULAR | Status: AC | PRN
  Administered 2019-12-29: 100 mL via INTRAVENOUS

## 2019-12-29 MED ORDER — IBUPROFEN 600 MG PO TABS: 600.0000 mg | ORAL_TABLET | Freq: Four times a day (QID) | ORAL | 0 refills | Status: DC | PRN

## 2019-12-29 MED ORDER — MORPHINE SULFATE (PF) 4 MG/ML IV SOLN
INTRAVENOUS | Status: AC
  Administered 2019-12-29: 4 mg via INTRAVENOUS
  Filled 2019-12-29: qty 1

## 2019-12-29 MED ORDER — HYDROCODONE-ACETAMINOPHEN 5-325 MG PO TABS: 1.0000 | ORAL_TABLET | ORAL | 0 refills | Status: DC | PRN

## 2019-12-29 MED ORDER — ONDANSETRON HCL 4 MG/2ML IJ SOLN
4.0000 mg | Freq: Once | INTRAMUSCULAR | Status: AC
  Administered 2019-12-29: 4 mg via INTRAVENOUS
  Filled 2019-12-29: qty 2

## 2019-12-29 MED ORDER — MORPHINE SULFATE (PF) 4 MG/ML IV SOLN: 4.0000 mg | Freq: Once | INTRAVENOUS | Status: AC
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
## 2019-12-29 NOTE — ED Notes (Signed)
Patient transported to CT 
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
## 2019-12-29 NOTE — ED Notes (Signed)
ED Provider at bedside. 

## 2020-02-19 ENCOUNTER — Other Ambulatory Visit: Payer: Self-pay

## 2020-02-19 ENCOUNTER — Emergency Department (HOSPITAL_BASED_OUTPATIENT_CLINIC_OR_DEPARTMENT_OTHER)
Admission: EM | Admit: 2020-02-19 | Discharge: 2020-02-20 | Disposition: A | Discharge status: Home / Self Care | Payer: Self-pay | Attending: Emergency Medicine | Admitting: Emergency Medicine

## 2020-02-19 ENCOUNTER — Encounter (HOSPITAL_BASED_OUTPATIENT_CLINIC_OR_DEPARTMENT_OTHER): Payer: Self-pay | Admitting: Emergency Medicine

## 2020-02-19 DIAGNOSIS — B9689 Other specified bacterial agents as the cause of diseases classified elsewhere: Secondary | ICD-10-CM | POA: Insufficient documentation

## 2020-02-19 DIAGNOSIS — N451 Epididymitis: Secondary | ICD-10-CM | POA: Insufficient documentation

## 2020-02-19 NOTE — ED Triage Notes (Signed)
Pt c/o swelling to testicle x 2 days ago

## 2020-02-20 ENCOUNTER — Ambulatory Visit (HOSPITAL_BASED_OUTPATIENT_CLINIC_OR_DEPARTMENT_OTHER): Payer: Self-pay | Attending: Emergency Medicine

## 2020-02-20 LAB — URINALYSIS, ROUTINE W REFLEX MICROSCOPIC
Bilirubin Urine: NEGATIVE
Glucose, UA: NEGATIVE mg/dL
Ketones, ur: NEGATIVE mg/dL
Nitrite: NEGATIVE
Protein, ur: NEGATIVE mg/dL
Specific Gravity, Urine: 1.025 (ref 1.005–1.030)
pH: 6 (ref 5.0–8.0)

## 2020-02-20 LAB — GC/CHLAMYDIA PROBE AMP (~~LOC~~) NOT AT ARMC
Chlamydia: NEGATIVE
Comment: NEGATIVE
Comment: NORMAL
Neisseria Gonorrhea: NEGATIVE

## 2020-02-20 LAB — URINALYSIS, MICROSCOPIC (REFLEX)

## 2020-02-20 MED ORDER — DOXYCYCLINE HYCLATE 100 MG PO CAPS: 100.0000 mg | ORAL_CAPSULE | Freq: Two times a day (BID) | ORAL | 0 refills | Status: DC

## 2020-02-20 MED ORDER — CEFTRIAXONE SODIUM 500 MG IJ SOLR
500.0000 mg | Freq: Once | INTRAMUSCULAR | Status: AC
  Administered 2020-02-20: 500 mg via INTRAMUSCULAR
  Filled 2020-02-20: qty 500

## 2020-02-20 MED ORDER — LIDOCAINE HCL (PF) 1 % IJ SOLN
INTRAMUSCULAR | Status: AC
  Administered 2020-02-20: 1 mL
  Filled 2020-02-20: qty 5

## 2020-02-20 MED ORDER — DOXYCYCLINE HYCLATE 100 MG PO TABS
100.0000 mg | ORAL_TABLET | Freq: Once | ORAL | Status: AC
  Administered 2020-02-20: 100 mg via ORAL
  Filled 2020-02-20: qty 1

## 2020-02-20 NOTE — ED Provider Notes (Signed)
[facility] Provider Note: Jeffrey Dell, MD, FACEP  CSN: [id number] MRN: [id number] ARRIVAL: 02/19/20 at 2332 ROOM: [facility]   CHIEF COMPLAINT  Testicle Swelling   HISTORY OF PRESENT ILLNESS  02/20/20 1:48 AM Jeffrey Cherry is a 44 y.o. male with a 2-day history of swelling of the right testicle.  He denies any trauma that initiated this.  He has some pain with this which she states is mild (5 out of 10), worse with palpation or movement of the testicle.  He has not noticed urethral discharge or dysuria.  He denies abdominal pain, fever or chills.   History reviewed. No pertinent past medical history.  History reviewed. No pertinent surgical history.  No family history on file.  Social History   Tobacco Use   Smoking status: Never Smoker   Smokeless tobacco: Never Used  Vaping Use   Vaping Use: Never used  Substance Use Topics   Alcohol use: Yes    Comment: soc   Drug use: Yes    Types: Marijuana    Prior to Admission medications   Medication Sig Start Date End Date Taking? Authorizing Provider  doxycycline (VIBRAMYCIN) 100 MG capsule Take 1 capsule (100 mg total) by mouth 2 (two) times daily. 02/20/20   Honestie Kulik, MD    Allergies Other   REVIEW OF SYSTEMS  Negative except as noted here or in the History of Present Illness.   PHYSICAL EXAMINATION  Initial Vital Signs Blood pressure 105/72, pulse 83, temperature 98.8 F (37.1 C), temperature source Oral, resp. rate 16, height 6' (1.829 m), weight 81.6 kg, SpO2 99 %.  Examination General: Well-developed, well-nourished male in no acute distress; appearance consistent with age of record HENT: normocephalic; atraumatic Eyes: Normal appearance Neck: supple Heart: regular rate and rhythm Lungs: clear to auscultation bilaterally Abdomen: soft; nondistended; nontender; bowel sounds present GU: Tanner V male, circumcised; no urethral discharge; enlarged, mildly tender right testicle and  epididymis; left testicle normal Extremities: No deformity; full range of motion Neurologic: Awake, alert and oriented; motor function intact in all extremities and symmetric; no facial droop Skin: Warm and dry Psychiatric: Normal mood and affect   RESULTS  Summary of this visit's results, reviewed and interpreted by myself:   EKG Interpretation  Date/Time:    Ventricular Rate:    PR Interval:    QRS Duration:   QT Interval:    QTC Calculation:   R Axis:     Text Interpretation:        Laboratory Studies: Results for orders placed or performed during the hospital encounter of 02/19/20 (from the past 24 hour(s))  Urinalysis, Routine w reflex microscopic     Status: Abnormal   Collection Time: 02/20/20 12:01 AM  Result Value Ref Range   Color, Urine YELLOW YELLOW   APPearance CLEAR CLEAR   Specific Gravity, Urine 1.025 1.005 - 1.030   pH 6.0 5.0 - 8.0   Glucose, UA NEGATIVE NEGATIVE mg/dL   Hgb urine dipstick TRACE (A) NEGATIVE   Bilirubin Urine NEGATIVE NEGATIVE   Ketones, ur NEGATIVE NEGATIVE mg/dL   Protein, ur NEGATIVE NEGATIVE mg/dL   Nitrite NEGATIVE NEGATIVE   Leukocytes,Ua TRACE (A) NEGATIVE  Urinalysis, Microscopic (reflex)     Status: Abnormal   Collection Time: 02/20/20 12:01 AM  Result Value Ref Range   RBC / HPF 0-5 0 - 5 RBC/hpf   WBC, UA 6-10 0 - 5 WBC/hpf   Bacteria, UA RARE (A) NONE SEEN   Squamous Epithelial /  LPF 0-5 0 - 5   Imaging Studies: No results found.  ED COURSE and MDM  Nursing notes, initial and subsequent vitals signs, including pulse oximetry, reviewed and interpreted by myself.  Vitals:   02/19/20 2343 02/19/20 2344  BP: 105/72   Pulse: 83   Resp: 16   Temp: 98.8 F (37.1 C)   TempSrc: Oral   SpO2: 99%   Weight:  81.6 kg  Height:  6' (1.829 m)   Medications  cefTRIAXone (ROCEPHIN) injection 500 mg (has no administration in time range)  doxycycline (VIBRA-TABS) tablet 100 mg (has no administration in time range)     Urine sent for culture and testing for gonorrhea and chlamydia.  In his demographic group STD associated epididymitis is the most likely diagnosis.  We will go ahead and treat for these and arrange for ultrasound later today.  The normal alignment of the testicle and the lack of severe pain argues against testicular torsion but this cannot be fully ruled out at this time.    PROCEDURES  Procedures   ED DIAGNOSES     ICD-10-CM   1. Epididymitis  N45.1        Lenardo Westwood, MD 02/20/20 0157

## 2020-02-21 LAB — URINE CULTURE: Culture: NO GROWTH

## 2020-07-01 ENCOUNTER — Telehealth (HOSPITAL_BASED_OUTPATIENT_CLINIC_OR_DEPARTMENT_OTHER): Payer: Self-pay | Admitting: Emergency Medicine

## 2020-07-01 ENCOUNTER — Emergency Department (HOSPITAL_BASED_OUTPATIENT_CLINIC_OR_DEPARTMENT_OTHER)
Admission: EM | Admit: 2020-07-01 | Discharge: 2020-07-01 | Disposition: A | Discharge status: Home / Self Care | Payer: HRSA Program | Attending: Emergency Medicine | Admitting: Emergency Medicine

## 2020-07-01 ENCOUNTER — Other Ambulatory Visit: Payer: Self-pay

## 2020-07-01 ENCOUNTER — Encounter (HOSPITAL_BASED_OUTPATIENT_CLINIC_OR_DEPARTMENT_OTHER): Payer: Self-pay | Admitting: Emergency Medicine

## 2020-07-01 DIAGNOSIS — R102 Pelvic and perineal pain: Secondary | ICD-10-CM | POA: Diagnosis present

## 2020-07-01 DIAGNOSIS — U071 COVID-19: Secondary | ICD-10-CM | POA: Insufficient documentation

## 2020-07-01 DIAGNOSIS — L02215 Cutaneous abscess of perineum: Secondary | ICD-10-CM | POA: Diagnosis not present

## 2020-07-01 LAB — RESP PANEL BY RT-PCR (FLU A&B, COVID) ARPGX2
Influenza A by PCR: NEGATIVE
Influenza B by PCR: NEGATIVE
SARS Coronavirus 2 by RT PCR: POSITIVE — AB

## 2020-07-01 MED ORDER — DOXYCYCLINE HYCLATE 100 MG PO TABS
100.0000 mg | ORAL_TABLET | Freq: Once | ORAL | Status: AC
  Administered 2020-07-01: 100 mg via ORAL
  Filled 2020-07-01: qty 1

## 2020-07-01 MED ORDER — ACETAMINOPHEN 325 MG PO TABS
650.0000 mg | ORAL_TABLET | Freq: Once | ORAL | Status: AC
  Administered 2020-07-01: 650 mg via ORAL
  Filled 2020-07-01: qty 2

## 2020-07-01 MED ORDER — DOXYCYCLINE HYCLATE 100 MG PO CAPS
100.0000 mg | ORAL_CAPSULE | Freq: Two times a day (BID) | ORAL | 0 refills | Status: AC
Start: 2020-07-01 — End: 2020-07-08

## 2020-07-01 MED ORDER — BUPIVACAINE HCL (PF) 0.5 % IJ SOLN
20.0000 mL | Freq: Once | INTRAMUSCULAR | Status: AC
  Administered 2020-07-01: 20 mL
  Filled 2020-07-01: qty 20

## 2020-07-01 NOTE — Discharge Instructions (Addendum)
Please read the attached information on abscesses.  Please go home and begin the bathtub and rinse the area out thoroughly.  You can keep the area bandaged as it will continue to bleed and seep some pus.  Please take antibiotics as prescribed for the entire course.  RETURN TO primary care doctor/urgent care/ED FOR RECHECK IN TWO DAYS.

## 2020-07-01 NOTE — ED Provider Notes (Signed)
[facility] EMERGENCY DEPARTMENT Provider Note   CSN: [id number] Arrival date & time: 07/01/20  1701     History Chief Complaint  Patient presents with  . Abscess    Jeffrey Cherry is a 44 y.o. male.  HPI Patient is a 44 year old male presented today with 3 days of tenderness to palpation warmth and pain and the area between his scrotum and his anus.  He states the area is tender to touch and painful.  He states it is achy and painful sitting.  He states he occasionally clips his hair down there.  He states today he started to feel achy and had some fevers and chills.  Denies any associated symptoms.  Aggravating mitigating factors.  He has had no pain of defecation, no blood in his stool.  He endorses some coughing but states it is mild so today he is unvaccinated for Covid.  No other associated symptoms.  No nausea vomiting lightheadedness or dizziness.  No abdominal pain.  No pain with defecation.  No urinary symptoms.    History reviewed. No pertinent past medical history.  There are no problems to display for this patient.   History reviewed. No pertinent surgical history.     No family history on file.  Social History   Tobacco Use  . Smoking status: Never Smoker  . Smokeless tobacco: Never Used  Vaping Use  . Vaping Use: Never used  Substance Use Topics  . Alcohol use: Yes    Comment: soc  . Drug use: Yes    Types: Marijuana    Home Medications Prior to Admission medications   Medication Sig Start Date End Date Taking? Authorizing Provider  doxycycline (VIBRAMYCIN) 100 MG capsule Take 1 capsule (100 mg total) by mouth 2 (two) times daily for 7 days. 07/01/20 07/08/20  Gailen Shelter, PA    Allergies    Other  Review of Systems   Review of Systems  Constitutional: Negative for chills and fever.  HENT: Negative for congestion.   Respiratory: Negative for shortness of breath.   Cardiovascular: Negative for chest pain.  Gastrointestinal:  Negative for abdominal pain.  Musculoskeletal: Negative for neck pain.  Skin:       Abscess     Physical Exam Updated Vital Signs BP 122/79 (BP Location: Right Arm)   Pulse (!) 102   Temp (!) 101.2 F (38.4 C) (Oral)   Resp 20   Ht 6' (1.829 m)   Wt 81.6 kg   SpO2 95%   BMI 24.41 kg/m   Physical Exam Vitals and nursing note reviewed.  Constitutional:      General: He is not in acute distress.    Comments: Pleasant well-appearing 44 year old.  In no acute distress.  Sitting comfortably in bed.  Able answer questions appropriately follow commands. No increased work of breathing. Speaking in full sentences.  HENT:     Head: Normocephalic and atraumatic.     Nose: Nose normal.  Eyes:     General: No scleral icterus. Cardiovascular:     Rate and Rhythm: Normal rate and regular rhythm.     Pulses: Normal pulses.     Heart sounds: Normal heart sounds.  Pulmonary:     Effort: Pulmonary effort is normal. No respiratory distress.     Breath sounds: No wheezing.  Abdominal:     Palpations: Abdomen is soft.     Tenderness: There is no abdominal tenderness.  Genitourinary:    Comments: With the scrotum at the  12 o'clock position of the anus at the 6 o'clock position patient has a approximately 3 x 4 cm in diameter fluctuant area with some warmth to touch and tenderness to palpation there is fluctuance. There is also some surrounding indurated tissue. Musculoskeletal:     Cervical back: Normal range of motion.     Right lower leg: No edema.     Left lower leg: No edema.  Skin:    General: Skin is warm and dry.     Capillary Refill: Capillary refill takes less than 2 seconds.  Neurological:     Mental Status: He is alert. Mental status is at baseline.  Psychiatric:        Mood and Affect: Mood normal.        Behavior: Behavior normal.     ED Results / Procedures / Treatments   Labs (all labs ordered are listed, but only abnormal results are displayed) Labs Reviewed    RESP PANEL BY RT-PCR (FLU A&B, COVID) [id number]    EKG None  Radiology No results found.  Procedures .Marland KitchenIncision and Drainage  Date/Time: 07/01/2020 6:43 PM Performed by: Gailen Shelter, PA Authorized by: Gailen Shelter, PA   Consent:    Consent obtained:  Verbal   Consent given by:  Patient   Risks discussed:  Bleeding, incomplete drainage, pain and damage to other organs   Alternatives discussed:  No treatment Universal protocol:    Procedure explained and questions answered to patient or proxy's satisfaction: yes     Relevant documents present and verified: yes     Test results available and properly labeled: yes     Imaging studies available: yes     Required blood products, implants, devices, and special equipment available: yes     Site/side marked: yes     Immediately prior to procedure a time out was called: yes     Patient identity confirmed:  Verbally with patient Location:    Type:  Abscess   Size:  4x3 cm Pre-procedure details:    Skin preparation:  Betadine Anesthesia (see MAR for exact dosages):    Anesthesia method:  Local infiltration   Local anesthetic:  Lidocaine 1% w/o epi Procedure type:    Complexity:  Complex Procedure details:    Needle aspiration: yes     Needle size:  18 G   Incision types:  Single straight and stab incision   Incision depth:  Subcutaneous   Scalpel blade:  11   Wound management:  Probed and deloculated, irrigated with saline and extensive cleaning   Drainage:  Purulent   Drainage amount:  Copious Post-procedure details:    Patient tolerance of procedure:  Tolerated well, no immediate complications Comments:     Patient tolerated procedure well   (including critical care time)  Medications Ordered in ED Medications  doxycycline (VIBRA-TABS) tablet 100 mg (has no administration in time range)  bupivacaine (MARCAINE) 0.5 % injection 20 mL (20 mLs Infiltration Given 07/01/20 1753)  acetaminophen (TYLENOL) tablet 650  mg (650 mg Oral Given 07/01/20 1752)    ED Course  I have reviewed the triage vital signs and the nursing notes.  Pertinent labs & imaging results that were available during my care of the patient were reviewed by me and considered in my medical decision making (see chart for details).    MDM Rules/Calculators/A&P  Patient is a 44 year old male presented today with abscess to the midline perineum.  It is between his scrotum and anus.  Patient notably is mildly febrile and mildly tachycardic on examination.  After Tylenol his tachycardia and fever resolved.  Incision and drainage of his abscess was successful.  Ultrasound guidance of the needle aspiration was successful incision and drainage was subsequently done.  Patient tolerated seizure well.  Copious drainage was expressed and numerous deep loculations were broken with cotton tip applicator.  Patient started on doxycycline he will follow-up in 2 days either in the ER, urgent care, PCP office or other medical provider for reevaluation.  Given return precautions and prescription for doxycycline.  He is also tested for Covid as he states he has had a mild cough and does have a fever I suspect that this is related to his abscess however as his lung exam is unremarkable.  Final Clinical Impression(s) / ED Diagnoses Final diagnoses:  Perineal abscess    Rx / DC Orders ED Discharge Orders         Ordered    doxycycline (VIBRAMYCIN) 100 MG capsule  2 times daily        07/01/20 1836           Gailen Shelter, [place] 07/01/20 1846    Charlynne Pander, MD 07/01/20 [phone]

## 2020-07-01 NOTE — ED Triage Notes (Signed)
Pt reports abscess to L axilla and groin area x 3 days.

## 2020-07-01 NOTE — ED Notes (Addendum)
Dressing applied, instructions given towards reapplying dressing. Gave some supplies to get wound coverage until bleeding / oozing stops.

## 2020-07-01 NOTE — Telephone Encounter (Signed)
Called pt and informed him of positive COVID test. Pt given instructions on quarantine. Pt verbalized understanding and did not have any questions.

## 2020-07-06 ENCOUNTER — Telehealth: Payer: Self-pay | Admitting: Nurse Practitioner

## 2020-07-06 ENCOUNTER — Ambulatory Visit (HOSPITAL_COMMUNITY): Payer: Self-pay

## 2020-07-06 ENCOUNTER — Other Ambulatory Visit: Payer: Self-pay | Admitting: Nurse Practitioner

## 2020-07-06 DIAGNOSIS — U071 COVID-19: Secondary | ICD-10-CM

## 2020-07-06 NOTE — Progress Notes (Signed)
I connected by phone with Genelle Bal on 07/06/2020 at 12:52 PM to discuss the potential use of a treatment for mild to moderate COVID-19 viral infection in non-hospitalized patients.  This patient is a 44 y.o. male that meets the FDA criteria for Emergency Use Authorization of bamlanivimab/etesevimab, casirivimab\imdevimab, or sotrovimab  Has a (+) direct SARS-CoV-2 viral test result  Has mild or moderate COVID-19   Is ? 44 years of age and weighs ? 40 kg  Is NOT hospitalized due to COVID-19  Is NOT requiring oxygen therapy or requiring an increase in baseline oxygen flow rate due to COVID-19  Is within 10 days of symptom onset  Has at least one of the high risk factor(s) for progression to severe COVID-19 and/or hospitalization as defined in EUA.  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 FDA has authorized the emergency use of bamlanivimab/etesevimab, casirivimab\imdevimab, or sotrovimab for the treatment of mild to moderate COVID-19 in adults and pediatric patients with positive results of direct SARS-CoV-2 viral testing who are 85 years of age and older weighing at least 40 kg, and who are at high risk for progressing to severe COVID-19 and/or hospitalization.  2. The significant known and potential risks and benefits of bamlanivimab/etesevimab, casirivimab\imdevimab, or sotrovimab, and the extent to which such potential risks and benefits are unknown.  3. Information on available alternative treatments and the risks and benefits of those alternatives, including clinical trials.  4. Patients treated with bamlanivimab/etesevimab, casirivimab\imdevimab, or sotrovimab should continue to self-isolate and use infection control measures (e.g., wear mask, isolate, social distance, avoid sharing personal items, clean and disinfect "high touch" surfaces, and  frequent handwashing) according to CDC guidelines.   5. The patient or parent/caregiver has the option to accept or refuse bamlanivimab/etesevimab, casirivimab\imdevimab, or sotrovimab.  After reviewing this information with the patient, the patient has agreed to receive one of the available covid 19 monoclonal antibodies and will be provided an appropriate fact sheet prior to infusion.Consuello Masse, DNP, AGNP-C [phone] (Infusion Center Hotline)

## 2020-07-06 NOTE — Telephone Encounter (Signed)
Called to Discuss with patient about Covid symptoms and the use of a monoclonal antibody infusion for those with mild to moderate Covid symptoms and at a high risk of hospitalization.     Pt is qualified for this infusion at the [place] infusion center due to co-morbid conditions and/or a member of an at-risk group.     Unable to reach pt. Left message to return call.   Rumi Kolodziej, DNP, AGNP-C [phone] (Infusion Center Hotline)  

## 2021-03-18 ENCOUNTER — Emergency Department (HOSPITAL_BASED_OUTPATIENT_CLINIC_OR_DEPARTMENT_OTHER): Payer: Worker's Compensation

## 2021-03-18 ENCOUNTER — Encounter (HOSPITAL_BASED_OUTPATIENT_CLINIC_OR_DEPARTMENT_OTHER): Payer: Self-pay | Admitting: Emergency Medicine

## 2021-03-18 ENCOUNTER — Emergency Department (HOSPITAL_BASED_OUTPATIENT_CLINIC_OR_DEPARTMENT_OTHER)
Admission: EM | Admit: 2021-03-18 | Discharge: 2021-03-18 | Disposition: A | Discharge status: Home / Self Care | Payer: Worker's Compensation | Attending: Emergency Medicine | Admitting: Emergency Medicine

## 2021-03-18 ENCOUNTER — Other Ambulatory Visit: Payer: Self-pay

## 2021-03-18 DIAGNOSIS — W268XXA Contact with other sharp object(s), not elsewhere classified, initial encounter: Secondary | ICD-10-CM | POA: Insufficient documentation

## 2021-03-18 DIAGNOSIS — Z23 Encounter for immunization: Secondary | ICD-10-CM | POA: Diagnosis not present

## 2021-03-18 DIAGNOSIS — Y9389 Activity, other specified: Secondary | ICD-10-CM | POA: Insufficient documentation

## 2021-03-18 DIAGNOSIS — Y9289 Other specified places as the place of occurrence of the external cause: Secondary | ICD-10-CM | POA: Insufficient documentation

## 2021-03-18 DIAGNOSIS — Y990 Civilian activity done for income or pay: Secondary | ICD-10-CM | POA: Insufficient documentation

## 2021-03-18 DIAGNOSIS — S81811A Laceration without foreign body, right lower leg, initial encounter: Secondary | ICD-10-CM | POA: Insufficient documentation

## 2021-03-18 DIAGNOSIS — S86101A Unspecified injury of other muscle(s) and tendon(s) of posterior muscle group at lower leg level, right leg, initial encounter: Secondary | ICD-10-CM | POA: Diagnosis present

## 2021-03-18 MED ORDER — CEPHALEXIN 500 MG PO CAPS
500.0000 mg | ORAL_CAPSULE | Freq: Two times a day (BID) | ORAL | 0 refills | Status: AC
Start: 2021-03-18 — End: 2021-03-25

## 2021-03-18 MED ORDER — TETANUS-DIPHTH-ACELL PERTUSSIS 5-2.5-18.5 LF-MCG/0.5 IM SUSY
0.5000 mL | PREFILLED_SYRINGE | Freq: Once | INTRAMUSCULAR | Status: AC
  Administered 2021-03-18: 0.5 mL via INTRAMUSCULAR
  Filled 2021-03-18: qty 0.5

## 2021-03-18 MED ORDER — LIDOCAINE-EPINEPHRINE (PF) 2 %-1:200000 IJ SOLN
20.0000 mL | Freq: Once | INTRAMUSCULAR | Status: AC
  Administered 2021-03-18: 20 mL
  Filled 2021-03-18: qty 20

## 2021-03-18 NOTE — ED Notes (Signed)
Laceration to right calf, patient hit with piece of metal at work last night, bleeding controlled

## 2021-03-18 NOTE — ED Provider Notes (Signed)
[facility] EMERGENCY DEPARTMENT Provider Note   CSN: [id number] Arrival date & time: 03/18/21  1114     History Chief Complaint  Patient presents with   Extremity Laceration    Jeffrey Cherry is a 45 y.o. male here for evaluation of laceration to right posterior leg.  Incident occurred approximately 9 hours PTA.  Occurred while at work when a metal plate fell on his leg.  Unknown last tetanus.  Ambulatory without difficulty.  Rates pain a 0/10.  Denies fever, chills, redness, warmth, bleeding, drainage, numbness, weakness.  Denies additional aggravating or relieving factors  History obtained from patient and past medical records. No interpretor was used.  HPI     History reviewed. No pertinent past medical history.  There are no problems to display for this patient.   History reviewed. No pertinent surgical history.     No family history on file.  Social History   Tobacco Use   Smoking status: Never   Smokeless tobacco: Never  Vaping Use   Vaping Use: Never used  Substance Use Topics   Alcohol use: Yes    Comment: soc   Drug use: Yes    Types: Marijuana    Home Medications Prior to Admission medications   Medication Sig Start Date End Date Taking? Authorizing Provider  cephALEXin (KEFLEX) 500 MG capsule Take 1 capsule (500 mg total) by mouth 2 (two) times daily for 7 days. 03/18/21 03/25/21 Yes Happy Ky A, PA-C    Allergies    Other  Review of Systems   Review of Systems  Constitutional: Negative.   HENT: Negative.    Respiratory: Negative.    Cardiovascular: Negative.   Gastrointestinal: Negative.   Genitourinary: Negative.   Musculoskeletal: Negative.   Skin:  Positive for wound.  Neurological: Negative.   All other systems reviewed and are negative.  Physical Exam Updated Vital Signs BP 99/67 (BP Location: Left Arm)   Pulse 65   Temp 98 F (36.7 C) (Oral)   Resp 18   Ht 6' (1.829 m)   Wt 81.6 kg   SpO2 99%   BMI 24.41  kg/m   Physical Exam Vitals and nursing note reviewed.  Constitutional:      General: He is not in acute distress.    Appearance: He is well-developed. He is not ill-appearing, toxic-appearing or diaphoretic.  HENT:     Head: Atraumatic.     Nose: Nose normal.     Mouth/Throat:     Mouth: Mucous membranes are moist.  Eyes:     Pupils: Pupils are equal, round, and reactive to light.  Cardiovascular:     Rate and Rhythm: Normal rate and regular rhythm.     Pulses:          Dorsalis pedis pulses are 2+ on the right side and 2+ on the left side.       Posterior tibial pulses are 2+ on the right side and 2+ on the left side.  Pulmonary:     Effort: Pulmonary effort is normal. No respiratory distress.  Abdominal:     General: There is no distension.     Palpations: Abdomen is soft.  Musculoskeletal:        General: Normal range of motion.     Cervical back: Normal range of motion and neck supple.     Comments: No bony tenderness, Able to plantarflex and dorsiflex without difficulty.  Skin:    General: Skin is warm and dry.  Capillary Refill: Capillary refill takes less than 2 seconds.     Findings: Laceration present.     Comments: 5 cm lac to posterior distal calf. Not over achillis tendon. No redness, warmth, bleeding, drainage  Neurological:     General: No focal deficit present.     Mental Status: He is alert and oriented to person, place, and time.     ED Results / Procedures / Treatments   Labs (all labs ordered are listed, but only abnormal results are displayed) Labs Reviewed - No data to display  EKG None  Radiology DG Tibia/Fibula Right  Result Date: 03/18/2021 CLINICAL DATA:  Laceration, object fell on leg EXAM: RIGHT TIBIA AND FIBULA - 2 VIEW COMPARISON:  None. FINDINGS: There is no acute fracture or dislocation. Knee and ankle alignment appear anatomic. The soft tissues are unremarkable. There is no radiopaque foreign body. IMPRESSION: Normal study.  Electronically Signed   By: Lesia Hausen MD   On: 03/18/2021 12:24    Procedures .Marland KitchenLaceration Repair  Date/Time: 03/18/2021 12:25 PM Performed by: Ralph Leyden A, PA-C Authorized by: Linwood Dibbles, PA-C   Consent:    Consent obtained:  Verbal   Consent given by:  Patient   Risks discussed:  Infection, need for additional repair, pain, poor cosmetic result and poor wound healing   Alternatives discussed:  No treatment and delayed treatment Universal protocol:    Procedure explained and questions answered to patient or proxy's satisfaction: yes     Relevant documents present and verified: yes     Test results available: yes     Imaging studies available: yes     Required blood products, implants, devices, and special equipment available: yes     Site/side marked: yes     Immediately prior to procedure, a time out was called: yes     Patient identity confirmed:  Verbally with patient Anesthesia:    Anesthesia method:  Local infiltration   Local anesthetic:  Lidocaine 1% WITH epi Laceration details:    Location:  Leg   Leg location:  R lower leg   Length (cm):  5   Depth (mm):  6 Pre-procedure details:    Preparation:  Patient was prepped and draped in usual sterile fashion and imaging obtained to evaluate for foreign bodies Exploration:    Hemostasis achieved with:  Direct pressure   Imaging obtained: x-ray     Imaging outcome: foreign body not noted     Wound exploration: wound explored through full range of motion and entire depth of wound visualized     Wound extent: no foreign bodies/material noted, no muscle damage noted, no nerve damage noted, no tendon damage noted, no underlying fracture noted and no vascular damage noted     Contaminated: no   Treatment:    Area cleansed with:  Povidone-iodine   Amount of cleaning:  Extensive   Irrigation solution:  Sterile saline   Irrigation method:  Pressure wash Skin repair:    Repair method:  Staples   Number of  staples:  8 Approximation:    Approximation:  Close Repair type:    Repair type:  Intermediate Post-procedure details:    Dressing:  Non-adherent dressing   Procedure completion:  Tolerated well, no immediate complications   Medications Ordered in ED Medications  Tdap (BOOSTRIX) injection 0.5 mL (0.5 mLs Intramuscular Given 03/18/21 1147)  lidocaine-EPINEPHrine (XYLOCAINE W/EPI) 2 %-1:200000 (PF) injection 20 mL (20 mLs Infiltration Given 03/18/21 1224)   ED Course  I have reviewed the triage vital signs and the nursing notes.  Pertinent labs & imaging results that were available during my care of the patient were reviewed by me and considered in my medical decision making (see chart for details).  Here for eval of laceration to posterior distal calf which occurred 10 hours PTA. No evidence of infection. No obvious foreign body. Tetanus updated. Full ROM without difficulty. Lac not over achillis tendon. NV intact. No obvious tendon or ligament exposure.  Wound irrigated.  Imaging without fracture, retained foreign body  Wound closed with staples. See procedure note. Tolerated well. Low suspicion for underlying bony, tendon, ligament, muscle, vascular injury. Ambulatory without difficulty  DC home with sx management, strict return precautions, abx given timing of closure. FU in 7-10 days for removal.  The patient has been appropriately medically screened and/or stabilized in the ED. I have low suspicion for any other emergent medical condition which would require further screening, evaluation or treatment in the ED or require inpatient management.  Patient is hemodynamically stable and in no acute distress.  Patient able to ambulate in department prior to ED.  Evaluation does not show acute pathology that would require ongoing or additional emergent interventions while in the emergency department or further inpatient treatment.  I have discussed the diagnosis with the patient and answered  all questions.  Pain is been managed while in the emergency department and patient has no further complaints prior to discharge.  Patient is comfortable with plan discussed in room and is stable for discharge at this time.  I have discussed strict return precautions for returning to the emergency department.  Patient was encouraged to follow-up with PCP/specialist refer to at discharge.     MDM Rules/Calculators/A&P                            Final Clinical Impression(s) / ED Diagnoses Final diagnoses:  Laceration of right lower extremity, initial encounter    Rx / DC Orders ED Discharge Orders          Ordered    cephALEXin (KEFLEX) 500 MG capsule  2 times daily        03/18/21 1229             Anai Lipson A, PA-C 03/18/21 1229    Virgina Norfolk, DO 03/18/21 1247

## 2021-03-18 NOTE — Discharge Instructions (Addendum)
Suture need to be removed in 7-10 days  Take the antibiotics as prescribed   Return for new or worsening symptoms such as increased bleeding, drainage, warmth

## 2021-03-18 NOTE — ED Triage Notes (Signed)
Reports he was cut by a metal plate falling on his leg at work at [employer] last night.  Reports he needs his tetanus shot as well.  Bleeding controlled.

## 2021-03-18 NOTE — ED Notes (Signed)
Laceration stapled by provider, triple antibiotic ointment applied, non stick gauze, wrapped with ace wrap.  Patient instructed on wound care.

## 2021-03-28 ENCOUNTER — Encounter (HOSPITAL_BASED_OUTPATIENT_CLINIC_OR_DEPARTMENT_OTHER): Payer: Self-pay

## 2021-03-28 ENCOUNTER — Emergency Department (HOSPITAL_BASED_OUTPATIENT_CLINIC_OR_DEPARTMENT_OTHER)
Admission: EM | Admit: 2021-03-28 | Discharge: 2021-03-28 | Disposition: A | Discharge status: Home / Self Care | Payer: Self-pay | Attending: Emergency Medicine | Admitting: Emergency Medicine

## 2021-03-28 ENCOUNTER — Other Ambulatory Visit: Payer: Self-pay

## 2021-03-28 DIAGNOSIS — S81811D Laceration without foreign body, right lower leg, subsequent encounter: Secondary | ICD-10-CM | POA: Insufficient documentation

## 2021-03-28 DIAGNOSIS — Z4802 Encounter for removal of sutures: Secondary | ICD-10-CM | POA: Insufficient documentation

## 2021-03-28 DIAGNOSIS — Z5189 Encounter for other specified aftercare: Secondary | ICD-10-CM

## 2021-03-28 DIAGNOSIS — X58XXXD Exposure to other specified factors, subsequent encounter: Secondary | ICD-10-CM | POA: Insufficient documentation

## 2021-03-28 NOTE — ED Notes (Signed)
Pt has staples to posterior right lower leg, area is slightly red

## 2021-03-28 NOTE — ED Triage Notes (Signed)
Pt arrives ambulatory to ED to have staples removed from leg, reports they were placed 10 days ago, denies any fever or drainage from the area.

## 2021-03-28 NOTE — ED Provider Notes (Signed)
[facility] EMERGENCY DEPARTMENT Provider Note   CSN: [id number] Arrival date & time: 03/28/21  1206     History Chief Complaint  Patient presents with   Suture / Staple Removal    Jeffrey Cherry is a 45 y.o. male who presents to the emergency department for staple removal from right lower leg laceration.  Patient sustained his right lower leg laceration 10 days prior, this was closed in the emergency department with staples, he was sent home on Keflex twice per day.  States the wound has been doing okay, it is mildly sore at times, but he has not noted any significant redness, drainage, or fevers.  No alleviating or aggravating factors.  He relays his tetanus was updated at his last visit.  HPI     History reviewed. No pertinent past medical history.  There are no problems to display for this patient.   History reviewed. No pertinent surgical history.     No family history on file.  Social History   Tobacco Use   Smoking status: Never   Smokeless tobacco: Never  Vaping Use   Vaping Use: Never used  Substance Use Topics   Alcohol use: Yes    Comment: soc   Drug use: Yes    Types: Marijuana    Home Medications Prior to Admission medications   Not on File    Allergies    Other  Review of Systems   Review of Systems  Constitutional:  Negative for chills and fever.  Respiratory:  Negative for shortness of breath.   Cardiovascular:  Negative for chest pain.  Gastrointestinal:  Negative for nausea and vomiting.  Skin:  Positive for wound. Negative for color change.  Neurological:  Negative for weakness and numbness.  All other systems reviewed and are negative.  Physical Exam Updated Vital Signs BP 96/62 (BP Location: Right Arm)   Pulse 67   Temp 98.1 F (36.7 C) (Oral)   Resp 14   Ht 6' (1.829 m)   Wt 81.6 kg   SpO2 98%   BMI 24.41 kg/m   Physical Exam Vitals and nursing note reviewed.  Constitutional:      General: He is not in  acute distress.    Appearance: He is well-developed.  HENT:     Head: Normocephalic and atraumatic.  Eyes:     General:        Right eye: No discharge.        Left eye: No discharge.     Conjunctiva/sclera: Conjunctivae normal.  Cardiovascular:     Comments: 2+ symmetric PT pulses. Pulmonary:     Effort: Pulmonary effort is normal.  Abdominal:     General: There is no distension.  Musculoskeletal:     Comments: Lower extremities: Patient has a wound to his posterior right distal lower leg with staples in place.  There is some scabbing present as well as mild swelling with mild wound eversion. No significant warmth or purulent drainage. No significant fluctuance or tenderness to palpation.   Neurological:     Mental Status: He is alert.     Comments: Clear speech.   Psychiatric:        Behavior: Behavior normal.        Thought Content: Thought content normal.    ED Results / Procedures / Treatments   Labs (all labs ordered are listed, but only abnormal results are displayed) Labs Reviewed - No data to display  EKG None  Radiology No results found.  Procedures Procedures   Medications Ordered in ED Medications - No data to display  ED Course  I have reviewed the triage vital signs and the nursing notes.  Pertinent labs & imaging results that were available during my care of the patient were reviewed by me and considered in my medical decision making (see chart for details).    MDM Rules/Calculators/A&P                           Patient presents to the emergency department for staple removal to his right lower leg laceration that he sustained 10 days prior.  Patient is nontoxic, resting comfortably, vitals without significant abnormality.  This area does not appear grossly infected.  There is some mild swelling/scabbing/wound eversion-we discussed risk/benefits of staple removal today as well as alternative removal in an additional 4 days given wound appears mostly  healed, but still has some swelling/eversion, patient would prefer to return in 4 days. I discussed treatment plan, need for follow-up, and return precautions with the patient. Provided opportunity for questions, patient confirmed understanding and is in agreement with plan.   This is a shared visit with supervising physician Dr. Criss Alvine who has independently evaluated patient & provided guidance in evaluation/management/disposition, in agreement with care    Final Clinical Impression(s) / ED Diagnoses Final diagnoses:  Encounter for wound re-check    Rx / DC Orders ED Discharge Orders     None        Cherly Anderson, PA-C 03/28/21 1354    Pricilla Loveless, MD 03/30/21 [phone]

## 2021-03-28 NOTE — ED Notes (Signed)
ED Provider at bedside. 

## 2021-03-28 NOTE — Discharge Instructions (Addendum)
You were seen in the emergency department today for a recheck of your wound.  Please return in 4 days to have the staples removed.  Return to the ER sooner for new or worsening symptoms including but not limited to new or worsening pain, increased swelling, redness, drainage, fever, or any other concerns.

## 2021-04-01 ENCOUNTER — Encounter (HOSPITAL_BASED_OUTPATIENT_CLINIC_OR_DEPARTMENT_OTHER): Payer: Self-pay | Admitting: *Deleted

## 2021-04-01 ENCOUNTER — Emergency Department (HOSPITAL_BASED_OUTPATIENT_CLINIC_OR_DEPARTMENT_OTHER)
Admission: EM | Admit: 2021-04-01 | Discharge: 2021-04-01 | Disposition: A | Discharge status: Home / Self Care | Payer: Self-pay | Attending: Emergency Medicine | Admitting: Emergency Medicine

## 2021-04-01 ENCOUNTER — Other Ambulatory Visit: Payer: Self-pay

## 2021-04-01 DIAGNOSIS — Z4802 Encounter for removal of sutures: Secondary | ICD-10-CM | POA: Insufficient documentation

## 2021-04-01 DIAGNOSIS — S81811D Laceration without foreign body, right lower leg, subsequent encounter: Secondary | ICD-10-CM | POA: Insufficient documentation

## 2021-04-01 DIAGNOSIS — X58XXXD Exposure to other specified factors, subsequent encounter: Secondary | ICD-10-CM | POA: Insufficient documentation

## 2021-04-01 NOTE — ED Provider Notes (Signed)
[facility] EMERGENCY DEPARTMENT Provider Note   CSN: [id number] Arrival date & time: 04/01/21  1639     History Chief Complaint  Patient presents with   Suture / Staple Removal    Jeffrey Cherry is a 45 y.o. male who presents today for staple removal.  He had been seen 2 weeks ago for a laceration to his right lower leg.  At that point his all tetanus was updated, dropped, repaired with staples.  He presented on the 15th of this month 4 days ago for consideration of staple repair, however they recommended that he defer for 4 more days. Patient states that he has not had any increased drainage or pain in the area.  He denies any fevers.    HPI     History reviewed. No pertinent past medical history.  There are no problems to display for this patient.   History reviewed. No pertinent surgical history.     No family history on file.  Social History   Tobacco Use   Smoking status: Never   Smokeless tobacco: Never  Vaping Use   Vaping Use: Never used  Substance Use Topics   Alcohol use: Yes    Comment: soc   Drug use: Yes    Types: Marijuana    Home Medications Prior to Admission medications   Not on File    Allergies    Other  Review of Systems   Review of Systems  Constitutional:  Negative for chills and fever.  Skin:  Negative for color change.       No drainage  Neurological:  Negative for weakness.  All other systems reviewed and are negative.  Physical Exam Updated Vital Signs BP 113/83 (BP Location: Left Arm)   Pulse 67   Temp 98.6 F (37 C) (Oral)   Resp 18   Ht 6' (1.829 m)   Wt 81.6 kg   SpO2 98%   BMI 24.40 kg/m   Physical Exam Vitals and nursing note reviewed.  Constitutional:      General: He is not in acute distress. HENT:     Head: Normocephalic and atraumatic.  Cardiovascular:     Rate and Rhythm: Normal rate.  Pulmonary:     Effort: Pulmonary effort is normal. No respiratory distress.  Musculoskeletal:      Cervical back: No rigidity.  Skin:    General: Skin is warm and dry.     Comments: Wound on posterior right lower leg is well approximated, no drainage, abnormal erythema, or induration.  The wound edges are well approximated and do not separate.    Neurological:     Mental Status: He is alert. Mental status is at baseline.     Comments: Awake and alert, answers all questions appropriately.  Speech is not slurred.    Psychiatric:        Mood and Affect: Mood normal.    ED Results / Procedures / Treatments   Labs (all labs ordered are listed, but only abnormal results are displayed) Labs Reviewed - No data to display  EKG None  Radiology No results found.  Procedures .Suture Removal  Date/Time: 04/01/2021 5:27 PM Performed by: Cristina Gong, PA-C Authorized by: Cristina Gong, PA-C   Consent:    Consent obtained:  Verbal   Consent given by:  Patient   Risks, benefits, and alternatives were discussed: yes     Risks discussed:  Bleeding, pain and wound separation (Infection, worsening swelling, or cosmetic result)  Alternatives discussed:  Delayed treatment (leaving staples in for longer.) Universal protocol:    Procedure explained and questions answered to patient or proxy's satisfaction: yes   Location:    Location:  Lower extremity   Lower extremity location:  Leg   Leg location:  R lower leg Procedure details:    Wound appearance:  No signs of infection, good wound healing and clean   Number of staples removed:  8 Post-procedure details:    Post-procedure assessment: Gauze, ACE wrap for support.   Procedure completion:  Tolerated well, no immediate complications Comments:     Area cleansed with rubbing alcohol prior to staple removal.    Medications Ordered in ED Medications - No data to display  ED Course  I have reviewed the triage vital signs and the nursing notes.  Pertinent labs & imaging results that were available during my care of the  patient were reviewed by me and considered in my medical decision making (see chart for details).    MDM Rules/Calculators/A&P                         Staple removal   Pt to ER for staple/suture removal and wound check as above. Procedure tolerated well. Vitals normal, no signs of infection. Scar minimization, continued care discussed.    Return precautions were discussed with patient who states their understanding.  At the time of discharge patient denied any unaddressed complaints or concerns.  Patient is agreeable for discharge home.  Note: Portions of this report may have been transcribed using voice recognition software. Every effort was made to ensure accuracy; however, inadvertent computerized transcription errors may be present   Final Clinical Impression(s) / ED Diagnoses Final diagnoses:  Encounter for staple removal    Rx / DC Orders ED Discharge Orders     None        Cristina Gong, PA-C 04/01/21 1730    Charlynne Pander, MD 04/06/21 [phone]

## 2021-04-01 NOTE — ED Triage Notes (Signed)
Staples intact to his right lower leg.

## 2021-04-01 NOTE — Discharge Instructions (Addendum)
Please take Ibuprofen (Advil, motrin) and Tylenol (acetaminophen) to relieve your pain.    You may take up to 600 MG (3 pills) of normal strength ibuprofen every 8 hours as needed.   You make take tylenol, up to 1,000 mg (two extra strength pills) every 8 hours as needed.   It is safe to take ibuprofen and tylenol at the same time as they work differently.   Do not take more than 3,000 mg tylenol in a 24 hour period (not more than one dose every 8 hours.  Please check all medication labels as many medications such as pain and cold medications may contain tylenol.  Do not drink alcohol while taking these medications.  Do not take other NSAID'S while taking ibuprofen (such as aleve or naproxen).  Please take ibuprofen with food to decrease stomach upset.  Please keep your wound clean and dry.
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
## 2021-07-10 ENCOUNTER — Other Ambulatory Visit: Payer: Self-pay

## 2021-07-10 ENCOUNTER — Encounter (HOSPITAL_BASED_OUTPATIENT_CLINIC_OR_DEPARTMENT_OTHER): Payer: Self-pay | Admitting: Emergency Medicine

## 2021-07-10 ENCOUNTER — Emergency Department (HOSPITAL_BASED_OUTPATIENT_CLINIC_OR_DEPARTMENT_OTHER)
Admission: EM | Admit: 2021-07-10 | Discharge: 2021-07-11 | Disposition: A | Discharge status: Home / Self Care | Payer: Self-pay | Attending: Emergency Medicine | Admitting: Emergency Medicine

## 2021-07-10 DIAGNOSIS — L0291 Cutaneous abscess, unspecified: Secondary | ICD-10-CM

## 2021-07-10 DIAGNOSIS — L02215 Cutaneous abscess of perineum: Secondary | ICD-10-CM | POA: Insufficient documentation

## 2021-07-10 DIAGNOSIS — K610 Anal abscess: Secondary | ICD-10-CM

## 2021-07-10 MED ORDER — LIDOCAINE HCL 2 % IJ SOLN
10.0000 mL | Freq: Once | INTRAMUSCULAR | Status: AC
  Administered 2021-07-10: 23:00:00 200 mg via INTRADERMAL
  Filled 2021-07-10: qty 20

## 2021-07-10 MED ORDER — LIDOCAINE-EPINEPHRINE-TETRACAINE (LET) TOPICAL GEL
3.0000 mL | Freq: Once | TOPICAL | Status: AC
  Administered 2021-07-10: 22:00:00 3 mL via TOPICAL
  Filled 2021-07-10: qty 3

## 2021-07-10 NOTE — ED Triage Notes (Signed)
Reports he has a boil to the left buttock for the last three day.  Reports he has been seen before and it keeps coming back.

## 2021-07-10 NOTE — ED Provider Notes (Addendum)
[facility] EMERGENCY DEPARTMENT Provider Note   CSN: [id number] Arrival date & time: 07/10/21  1926     History Chief Complaint  Patient presents with   Abscess    Jeffrey Cherry is a 45 y.o. male.  HPI  45 year old male presents the emergency department today for evaluation of an abscess to the perineal area.  He started having symptoms about 4 days ago.  He has some pain and swelling to the area.  There is no pain to the rectum and he has no painful bowel movements.  He has no pain or swelling to the scrotum.  He has had no fevers or systemic symptoms at home.  He has had similar symptoms in the past and required I&D in the emergency department.  He has never had surgical drainage of the area.  No past medical history on file.  There are no problems to display for this patient.   History reviewed. No pertinent surgical history.     No family history on file.  Social History   Tobacco Use   Smoking status: Never   Smokeless tobacco: Never  Vaping Use   Vaping Use: Never used  Substance Use Topics   Alcohol use: Yes    Comment: soc   Drug use: Yes    Types: Marijuana    Home Medications Prior to Admission medications   Not on File    Allergies    Other  Review of Systems   Review of Systems  Constitutional:  Negative for fever.  Gastrointestinal:  Negative for abdominal pain and rectal pain.  Genitourinary:  Negative for dysuria, penile swelling, scrotal swelling and testicular pain.  Skin:        abscess   Physical Exam Updated Vital Signs BP 111/79 (BP Location: Left Arm)   Pulse 70   Temp 98.8 F (37.1 C) (Oral)   Resp 16   Ht 6' (1.829 m)   Wt 81.6 kg   SpO2 99%   BMI 24.41 kg/m   Physical Exam Constitutional:      General: He is not in acute distress.    Appearance: He is well-developed.  Eyes:     Conjunctiva/sclera: Conjunctivae normal.  Cardiovascular:     Rate and Rhythm: Normal rate.  Pulmonary:     Effort:  Pulmonary effort is normal.  Genitourinary:    Comments: Chaperone present. 3x4cm area of induration and fluctuance to the left perineal area. The would does not track towards the rectum or scrotum. Rectal exam was completed and pt did not have any fluctuance or pain with rectal exam. No crepitus noted. Skin:    General: Skin is warm and dry.  Neurological:     Mental Status: He is alert and oriented to person, place, and time.    ED Results / Procedures / Treatments   Labs (all labs ordered are listed, but only abnormal results are displayed) Labs Reviewed  CBC WITH DIFFERENTIAL/PLATELET  BASIC METABOLIC PANEL    EKG None  Radiology No results found.  Procedures .Marland KitchenIncision and Drainage  Date/Time: 07/10/2021 11:36 PM Performed by: Karrie Meres, PA-C Authorized by: Karrie Meres, PA-C   Consent:    Consent obtained:  Verbal   Consent given by:  Patient   Risks, benefits, and alternatives were discussed: yes     Risks discussed:  Bleeding, incomplete drainage, pain and damage to other organs   Alternatives discussed:  No treatment Universal protocol:    Procedure explained and questions  answered to patient or proxy's satisfaction: yes     Patient identity confirmed:  Verbally with patient Location:    Type:  Abscess (perineum)   Size:  3x4cm   Location:  Anogenital   Anogenital location:  Perineum Pre-procedure details:    Skin preparation:  Povidone-iodine Anesthesia:    Anesthesia method:  Topical application and local infiltration   Topical anesthetic:  LET   Local anesthetic:  Lidocaine 2% WITH epi Procedure type:    Complexity:  Simple Procedure details:    Ultrasound guidance: yes     Needle aspiration: yes     Needle size:  18 G   Incision types:  Stab incision   Incision depth:  Dermal   Drainage:  Bloody and purulent   Drainage amount:  Scant   Packing materials:  1/2 in iodoform gauze Post-procedure details:    Procedure completion:   Tolerated   Medications Ordered in ED Medications  lidocaine-EPINEPHrine-tetracaine (LET) topical gel (3 mLs Topical Given 07/10/21 2143)  lidocaine (XYLOCAINE) 2 % (with pres) injection 200 mg (200 mg Intradermal Given 07/10/21 2307)    ED Course  I have reviewed the triage vital signs and the nursing notes.  Pertinent labs & imaging results that were available during my care of the patient were reviewed by me and considered in my medical decision making (see chart for details).    MDM Rules/Calculators/A&P                          45 year old male presents emergency department today for evaluation of swelling and pain to the left perineal area.  Has had an abscess in the area before that has required I&D in the ED.  No history of diabetes.  No systemic symptoms.  On exam does have a fluctuant indurated area to the left perineum.  There is no rectal or scrotal involvement identified.  Small incision was made to the lateral aspect of the abscess and there was mild amount of bloody drainage but no purulent drainage.  Supervising physician evaluated patient and under ultrasound guidance using 18-gauge syringe to pull off about 10 cc of purulent fluid.  An incision was made in this exact area and will bloody drainage was identified.  Under ultrasound guidance the area was probed.  There continued to be only a small amount of bloody fluid present.  Due to concern for residual fluid collection that can be observed on ultrasound in this area, will obtain CT of the pelvis to rule out any fistulous tracts or deeper infection.  Labs and CT ordered.  At shift change care transition to Dr. Eudelia Bunch with plan to follow-up on CT and determine appropriate disposition.  Final Clinical Impression(s) / ED Diagnoses Final diagnoses:  Abscess    Rx / DC Orders ED Discharge Orders     None        Karrie Meres, PA-C 07/10/21 2336    Karrie Meres, PA-C 07/10/21 2337    Maia Plan,  MD 07/11/21 [phone]

## 2021-07-10 NOTE — ED Notes (Signed)
Dr. Jacqulyn Bath and Samson Frederic, PA at bedside for abscess I&D

## 2021-07-11 ENCOUNTER — Emergency Department (HOSPITAL_BASED_OUTPATIENT_CLINIC_OR_DEPARTMENT_OTHER): Payer: Self-pay

## 2021-07-11 ENCOUNTER — Emergency Department (HOSPITAL_BASED_OUTPATIENT_CLINIC_OR_DEPARTMENT_OTHER)
Admission: EM | Admit: 2021-07-11 | Discharge: 2021-07-11 | Disposition: A | Discharge status: Home / Self Care | Payer: Self-pay | Attending: Emergency Medicine | Admitting: Emergency Medicine

## 2021-07-11 ENCOUNTER — Other Ambulatory Visit: Payer: Self-pay

## 2021-07-11 ENCOUNTER — Encounter (HOSPITAL_BASED_OUTPATIENT_CLINIC_OR_DEPARTMENT_OTHER): Payer: Self-pay | Admitting: Urology

## 2021-07-11 DIAGNOSIS — T370X5A Adverse effect of sulfonamides, initial encounter: Secondary | ICD-10-CM | POA: Insufficient documentation

## 2021-07-11 DIAGNOSIS — R21 Rash and other nonspecific skin eruption: Secondary | ICD-10-CM | POA: Insufficient documentation

## 2021-07-11 DIAGNOSIS — T7840XA Allergy, unspecified, initial encounter: Secondary | ICD-10-CM

## 2021-07-11 LAB — CBC WITH DIFFERENTIAL/PLATELET
Abs Immature Granulocytes: 0.05 10*3/uL (ref 0.00–0.07)
Basophils Absolute: 0.1 10*3/uL (ref 0.0–0.1)
Basophils Relative: 0 %
Eosinophils Absolute: 0.2 10*3/uL (ref 0.0–0.5)
Eosinophils Relative: 2 %
HCT: 41.9 % (ref 39.0–52.0)
Hemoglobin: 13.9 g/dL (ref 13.0–17.0)
Immature Granulocytes: 0 %
Lymphocytes Relative: 15 %
Lymphs Abs: 1.9 10*3/uL (ref 0.7–4.0)
MCH: 26.9 pg (ref 26.0–34.0)
MCHC: 33.2 g/dL (ref 30.0–36.0)
MCV: 81.2 fL (ref 80.0–100.0)
Monocytes Absolute: 1.2 10*3/uL — ABNORMAL HIGH (ref 0.1–1.0)
Monocytes Relative: 9 %
Neutro Abs: 9.3 10*3/uL — ABNORMAL HIGH (ref 1.7–7.7)
Neutrophils Relative %: 74 %
Platelets: 251 10*3/uL (ref 150–400)
RBC: 5.16 MIL/uL (ref 4.22–5.81)
RDW: 13.1 % (ref 11.5–15.5)
WBC: 12.7 10*3/uL — ABNORMAL HIGH (ref 4.0–10.5)
nRBC: 0 % (ref 0.0–0.2)

## 2021-07-11 LAB — BASIC METABOLIC PANEL
Anion gap: 10 (ref 5–15)
BUN: 6 mg/dL (ref 6–20)
CO2: 25 mmol/L (ref 22–32)
Calcium: 8.8 mg/dL — ABNORMAL LOW (ref 8.9–10.3)
Chloride: 98 mmol/L (ref 98–111)
Creatinine, Ser: 0.84 mg/dL (ref 0.61–1.24)
GFR, Estimated: >60 mL/min (ref >60)
Glucose, Bld: 101 mg/dL — ABNORMAL HIGH (ref 70–99)
Potassium: 3.6 mmol/L (ref 3.5–5.1)
Sodium: 133 mmol/L — ABNORMAL LOW (ref 135–145)

## 2021-07-11 MED ORDER — SULFAMETHOXAZOLE-TRIMETHOPRIM 800-160 MG PO TABS: 1.0000 | ORAL_TABLET | Freq: Two times a day (BID) | ORAL | 0 refills | Status: AC

## 2021-07-11 MED ORDER — DOXYCYCLINE HYCLATE 100 MG PO CAPS: 100.0000 mg | ORAL_CAPSULE | Freq: Two times a day (BID) | ORAL | 0 refills | Status: AC

## 2021-07-11 MED ORDER — DOXYCYCLINE HYCLATE 100 MG PO CAPS: 100.0000 mg | ORAL_CAPSULE | Freq: Two times a day (BID) | ORAL | 0 refills | Status: DC

## 2021-07-11 MED ORDER — IOHEXOL 300 MG/ML  SOLN
100.0000 mL | Freq: Once | INTRAMUSCULAR | Status: AC | PRN
  Administered 2021-07-11: 01:00:00 100 mL via INTRAVENOUS

## 2021-07-11 MED ORDER — HYDROMORPHONE HCL 1 MG/ML IJ SOLN
1.0000 mg | Freq: Once | INTRAMUSCULAR | Status: AC
  Administered 2021-07-11: 04:00:00 1 mg via INTRAVENOUS
  Filled 2021-07-11: qty 1

## 2021-07-11 MED ORDER — LIDOCAINE-EPINEPHRINE (PF) 2 %-1:200000 IJ SOLN
10.0000 mL | Freq: Once | INTRAMUSCULAR | Status: AC
  Administered 2021-07-11: 10 mL via INTRADERMAL
  Filled 2021-07-11: qty 20

## 2021-07-11 MED ORDER — METHYLPREDNISOLONE SODIUM SUCC 125 MG IJ SOLR
125.0000 mg | Freq: Once | INTRAMUSCULAR | Status: AC
  Administered 2021-07-11: 15:00:00 125 mg via INTRAMUSCULAR
  Filled 2021-07-11: qty 2

## 2021-07-11 MED ORDER — DIPHENHYDRAMINE HCL 25 MG PO CAPS
50.0000 mg | ORAL_CAPSULE | Freq: Once | ORAL | Status: AC
  Administered 2021-07-11: 15:00:00 50 mg via ORAL
  Filled 2021-07-11: qty 2

## 2021-07-11 MED ORDER — OXYCODONE HCL 5 MG PO TABS: 2.5000 mg | ORAL_TABLET | Freq: Four times a day (QID) | ORAL | 0 refills | Status: AC | PRN

## 2021-07-11 NOTE — ED Notes (Signed)
Pt here for a boil on his  bottom given antibiotics beactrim and took one pill broke out in hives  and his mouth started to hurt pt medicated at triage

## 2021-07-11 NOTE — ED Notes (Signed)
Dr. Eudelia Bunch at bedside for procedure.

## 2021-07-11 NOTE — ED Triage Notes (Signed)
Pt states got Rx for bactrim, took 1 tab 1 hr ago, states voice changes and hives. States inside of mouth feels tingling.

## 2021-07-11 NOTE — ED Provider Notes (Signed)
I assumed care of this patient.  Please see previous provider note for further details of Hx, PE.  Briefly patient is a 45 y.o. male who presented for perianal abscess. I&D and [place] concerning for deeper infection, pending CT.  CT notable for  approx 5 x 1.5 cm phlegmon collection. Additional, deeper I&D performed. See below.  Marland Kitchen.Incision and Drainage  Date/Time: 07/11/2021 4:51 AM Performed by: Nira Conn, MD Authorized by: Nira Conn, MD   Consent:    Consent obtained:  Verbal   Consent given by:  Patient   Risks discussed:  Bleeding, damage to other organs, infection and incomplete drainage   Alternatives discussed:  No treatment, alternative treatment and observation Universal protocol:    Immediately prior to procedure, a time out was called: yes     Patient identity confirmed:  Verbally with patient and arm band Location:    Type:  Abscess   Size:  4.5 x 1.5   Location:  Anogenital   Anogenital location:  Perianal Pre-procedure details:    Skin preparation:  Povidone-iodine Sedation:    Sedation type:  None Anesthesia:    Anesthesia method:  Local infiltration   Local anesthetic:  Lidocaine 2% WITH epi Procedure type:    Complexity:  Complex Procedure details:    Incision types:  Single straight   Incision depth:  Subcutaneous   Wound management:  Probed and deloculated and extensive cleaning   Drainage:  Bloody and purulent   Drainage amount:  Moderate   Wound treatment:  Wound left open   Packing materials:  1/2 in iodoform gauze   Amount 1/2" iodoform:  Approx 4 in. Post-procedure details:    Procedure completion:  Tolerated well, no immediate complications   Appropriate for continued outpatient management.  The patient appears reasonably screened and/or stabilized for discharge and I doubt any other medical condition or other [facility] requiring further screening, evaluation, or treatment in the ED at this time prior to discharge. Safe for  discharge with strict return precautions.  Disposition: Discharge  Condition: Good  I have discussed the results, Dx and Tx plan with the patient/family who expressed understanding and agree(s) with the plan. Discharge instructions discussed at length. The patient/family was given strict return precautions who verbalized understanding of the instructions. No further questions at time of discharge.    ED Discharge Orders          Ordered    oxyCODONE (ROXICODONE) 5 MG immediate release tablet  Every 6 hours PRN        07/11/21 0442    sulfamethoxazole-trimethoprim (BACTRIM DS) 800-160 MG tablet  2 times daily        07/11/21 0442            [facility] narcotic database reviewed and no active prescriptions noted.   Follow Up: Surgery, [facility] [address] [place] [address] [phone]  Call  to schedule an appointment for close follow up for recurring perianal abscess  [facility] [place] [facility] [id number] [place] [address] [phone] In 2 days for wound re-check        Nira Conn, MD 07/11/21 [phone]

## 2021-07-11 NOTE — ED Provider Notes (Signed)
Emergency Department Provider Note   I have reviewed the triage vital signs and the nursing notes.   HISTORY  Chief Complaint Allergic Reaction   HPI Jeffrey Cherry is a 45 y.o. male returns to the emergency department for evaluation of concern for an allergic reaction after starting Bactrim.  He took 1 tablet of Bactrim after he was treated in the emergency department yesterday for abscess.  He denies known history of reaction to this medicine.  He developed a scratchy voice and rash.  Never developed tightness in the throat, lip or tongue swelling, lightheadedness, abdominal symptoms.  Denies fevers or problems with his wound or dressings from yesterday.  He is requesting a different antibiotic.   History reviewed. No pertinent past medical history.  There are no problems to display for this patient.   History reviewed. No pertinent surgical history.  Allergies Bactrim [sulfamethoxazole-trimethoprim] and Other  History reviewed. No pertinent family history.  Social History Social History   Tobacco Use   Smoking status: Never   Smokeless tobacco: Never  Vaping Use   Vaping Use: Never used  Substance Use Topics   Alcohol use: Yes    Comment: soc   Drug use: Yes    Types: Marijuana    Review of Systems  Constitutional: No fever/chills Eyes: No visual changes. ENT: No sore throat. Cardiovascular: Denies chest pain. Respiratory: Denies shortness of breath. Gastrointestinal: No abdominal pain.  No nausea, no vomiting.  No diarrhea.  No constipation. Genitourinary: Negative for dysuria. Musculoskeletal: Negative for back pain. Skin: Positive rash.  Neurological: Negative for headaches, focal weakness or numbness.  10-point ROS otherwise negative.  ____________________________________________   PHYSICAL EXAM:  VITAL SIGNS: ED Triage Vitals  Enc Vitals Group     BP 07/11/21 1514 108/82     Pulse Rate 07/11/21 1514 86     Resp 07/11/21 1514 18      Temp 07/11/21 1514 98.7 F (37.1 C)     Temp Source 07/11/21 1514 Oral     SpO2 07/11/21 1512 98 %     Weight 07/11/21 1512 180 lb (81.6 kg)     Height 07/11/21 1512 6' (1.829 m)   Constitutional: Alert and oriented. Well appearing and in no acute distress. Eyes: Conjunctivae are normal.  Head: Atraumatic. Nose: No congestion/rhinnorhea. Mouth/Throat: Mucous membranes are moist.  Neck: No stridor.   Cardiovascular: Normal rate, regular rhythm. Good peripheral circulation. Grossly normal heart sounds.   Respiratory: Normal respiratory effort.  No retractions. Lungs CTAB. Gastrointestinal: No distention.  Musculoskeletal: No gross deformities of extremities. Neurologic:  Normal speech and language.  Skin:  Skin is warm, dry and intact. No rash noted.  ____________________________________________   PROCEDURES  Procedure(s) performed:   Procedures  None  ____________________________________________   INITIAL IMPRESSION / ASSESSMENT AND PLAN / ED COURSE  Pertinent labs & imaging results that were available during my care of the patient were reviewed by me and considered in my medical decision making (see chart for details).   Patient presents to the emergency department with reaction to medication given yesterday for gluteal abscess after drainage.  I have listed Bactrim as an allergy in the chart.  He is not having any active symptoms at this time.  Rash is rapidly resolving after medications given here.  No concern clinically for anaphylaxis.  No mucosal lesions.  We took the antibiotic from the patient at his request so that he would not accidentally take additional medicines.  We will call in doxycycline  to his pharmacy. Discussed ED return precautions.    ____________________________________________  FINAL CLINICAL IMPRESSION(S) / ED DIAGNOSES  Final diagnoses:  Allergic reaction to drug, initial encounter    MEDICATIONS GIVEN DURING THIS VISIT:  Medications   diphenhydrAMINE (BENADRYL) capsule 50 mg (50 mg Oral Given 07/11/21 1520)  methylPREDNISolone sodium succinate (SOLU-MEDROL) 125 mg/2 mL injection 125 mg (125 mg Intramuscular Given 07/11/21 1521)    Note:  This document was prepared using Dragon voice recognition software and may include unintentional dictation errors.  Alona Bene, MD, [facility] Emergency Medicine    Edinson Domeier, Arlyss Repress, MD 07/12/21 [phone]

## 2021-07-11 NOTE — Discharge Instructions (Signed)
You were seen in the emergency room today after you had an allergic reaction to Bactrim.  Please be sure to tell future providers that you are allergic to this medicine so they would not prescribe it again.  I have called in a new medication to the pharmacy. Please take as directed.  You should be seen in the next 48 hours to evaluate your abscess.  I have listed the name of the surgeon for follow-up but you will have to call to make the appointment.  You may also return to the emergency department for reevaluation at that time.

## 2021-11-04 ENCOUNTER — Encounter (HOSPITAL_BASED_OUTPATIENT_CLINIC_OR_DEPARTMENT_OTHER): Payer: Self-pay | Admitting: Emergency Medicine

## 2021-11-04 ENCOUNTER — Emergency Department (HOSPITAL_BASED_OUTPATIENT_CLINIC_OR_DEPARTMENT_OTHER)
Admission: EM | Admit: 2021-11-04 | Discharge: 2021-11-04 | Disposition: A | Discharge status: Home / Self Care | Payer: Self-pay | Attending: Emergency Medicine | Admitting: Emergency Medicine

## 2021-11-04 DIAGNOSIS — L739 Follicular disorder, unspecified: Secondary | ICD-10-CM | POA: Insufficient documentation

## 2021-11-04 MED ORDER — DOXYCYCLINE HYCLATE 100 MG PO CAPS: 100.0000 mg | ORAL_CAPSULE | Freq: Two times a day (BID) | ORAL | 0 refills | Status: AC

## 2021-11-04 NOTE — ED Provider Notes (Signed)
? ?  [facility] EMERGENCY DEPARTMENT  ?Provider Note ? ?CSN: [id number] ?Arrival date & time: 11/04/21 0343 ? ?History ?Chief Complaint  ?Patient presents with  ? Rash  ? ? ?Jeffrey Cherry is a 46 y.o. male reports the skin of his face has been 'broken out' for a few weeks. He has had bumps coming and going around his mouth and nose. He was concerned it might be an STD but he is not having any genital complaints. He has a full beard.  ? ? ?Home Medications ?Prior to Admission medications   ?Medication Sig Start Date End Date Taking? Authorizing Provider  ?doxycycline (VIBRAMYCIN) 100 MG capsule Take 1 capsule (100 mg total) by mouth 2 (two) times daily. 11/04/21  Yes Pollyann Savoy, MD  ? ? ? ?Allergies    ?Bactrim [sulfamethoxazole-trimethoprim] and Other ? ? ?Review of Systems   ?Review of Systems ?Please see HPI for pertinent positives and negatives ? ?Physical Exam ?BP 123/89 (BP Location: Right Arm)   Pulse 74   Temp 98.1 ?F (36.7 ?C) (Oral)   Resp 16   Ht 6' (1.829 m)   Wt 77.1 kg   SpO2 99%   BMI 23.06 kg/m?  ? ?Physical Exam ?Vitals and nursing note reviewed.  ?HENT:  ?   Head: Normocephalic.  ?   Nose: Nose normal.  ?   Mouth/Throat:  ?   Comments: Rare lesions on face (periorbital and perinasal) most consistent with folliculitis, no vesicles to suggest HSV.  ?Eyes:  ?   Extraocular Movements: Extraocular movements intact.  ?Pulmonary:  ?   Effort: Pulmonary effort is normal.  ?Musculoskeletal:     ?   General: Normal range of motion.  ?   Cervical back: Neck supple.  ?Skin: ?   Findings: No rash (on exposed skin).  ?Neurological:  ?   Mental Status: He is alert and oriented to person, place, and time.  ?Psychiatric:     ?   Mood and Affect: Mood normal.  ? ? ?ED Results / Procedures / Treatments   ?EKG ?None ? ?Procedures ?Procedures ? ?Medications Ordered in the ED ?Medications - No data to display ? ?Initial Impression and Plan ? Patient here with facial folliculitis, reassured not  consistent with STD. Given duration of symptoms will trial a course of doxycycline. PCP follow up. General information regarding folliculitis given.  ? ?ED Course  ? ?  ? ? ?MDM Rules/Calculators/A&P ?Medical Decision Making ?Problems Addressed: ?Folliculitis: acute illness or injury ? ?Risk ?Prescription drug management. ? ? ? ?Final Clinical Impression(s) / ED Diagnoses ?Final diagnoses:  ?Folliculitis  ? ? ?Rx / DC Orders ?ED Discharge Orders   ? ?      Ordered  ?  doxycycline (VIBRAMYCIN) 100 MG capsule  2 times daily       ? 11/04/21 0402  ? ?  ?  ? ?  ? ?  ?Pollyann Savoy, MD ?11/04/21 0403 ? ?

## 2021-11-04 NOTE — ED Triage Notes (Signed)
Pt requesting STD test states face is breaking out more than usually. Does not confirm exposure.  ?

## 2023-02-05 ENCOUNTER — Encounter (HOSPITAL_BASED_OUTPATIENT_CLINIC_OR_DEPARTMENT_OTHER): Payer: Self-pay

## 2023-02-05 ENCOUNTER — Other Ambulatory Visit: Payer: Self-pay

## 2023-02-05 ENCOUNTER — Emergency Department (HOSPITAL_BASED_OUTPATIENT_CLINIC_OR_DEPARTMENT_OTHER)
Admission: EM | Admit: 2023-02-05 | Discharge: 2023-02-05 | Disposition: A | Discharge status: Home / Self Care | Payer: Medicaid Other | Attending: Emergency Medicine | Admitting: Emergency Medicine

## 2023-02-05 DIAGNOSIS — L739 Follicular disorder, unspecified: Secondary | ICD-10-CM

## 2023-02-05 DIAGNOSIS — L089 Local infection of the skin and subcutaneous tissue, unspecified: Secondary | ICD-10-CM | POA: Diagnosis present

## 2023-02-05 DIAGNOSIS — L98491 Non-pressure chronic ulcer of skin of other sites limited to breakdown of skin: Secondary | ICD-10-CM | POA: Insufficient documentation

## 2023-02-05 DIAGNOSIS — L98431 Non-pressure chronic ulcer of abdomen limited to breakdown of skin: Secondary | ICD-10-CM

## 2023-02-05 DIAGNOSIS — Z711 Person with feared health complaint in whom no diagnosis is made: Secondary | ICD-10-CM

## 2023-02-05 LAB — HIV ANTIBODY (ROUTINE TESTING W REFLEX): HIV Screen 4th Generation wRfx: NONREACTIVE

## 2023-02-05 NOTE — Discharge Instructions (Addendum)
You were tested today for a number of sexually transmitted diseases.  The tests that we performed were for chlamydia, gonorrhea, syphilis (RPR), and HIV, as well as blood tests for HSV (herpes) and as a PCR (swab) of that specific bump. The results of these tests will not come back for 24 to 48 hours on average.  Please follow the instructions on your discharge paperwork to set up your patient portal and check for these results. If your RPR, GC/Chlamydia are positive you need to present for further treatment for syphilis, gonorrhea and or chlamydia, and if you have a positive HIV, HSV, test I also recommend that you return for further evaluation and to discuss this diagnosis.  In the meantime I would refrain from any sexual activity for at least 10 days, or until your symptoms fully resolve. Please inform any of your sexual partners of any positive diagnoses and I recommend that you engage in safe sex practices in the future including using a condom, and asking all of your sexual partners about their current STD history.  If your symptoms do not resolve despite treatment please return to the emergency department or the health department for further evaluation.  Overall despite the testing that we are doing to be better safe than sorry I am most suspicious that you have some irritation of a folliculitis follow-up from your belt buckle, you can keep it covered with a protective bandage, you can clean the groin as well as your beard hair with benzoyl peroxide which is an over-the-counter soap that helps to prevent folliculitis, if you have any bumps or ulcers like you do currently you can use a small amount of Polysporin, Neosporin directly on top of the bump while it is healing.

## 2023-02-05 NOTE — ED Provider Notes (Signed)
[place] EM[facility]ENCY DE[facility]MENT [facility] Provider Note   CSN: [id number] Arrival date & time: 02/05/23  1420     History  Chief Complaint  Patient presents with   Skin Problem    Kele Barthelemy is a 47 y.o. male with overall noncontributory past medical history presents concern for some skin lesions on face as well as on suprapubic region where his belt buckle rubs.  Patient reports that he has had some unprotected sex with new partners, he denies any penile discharge, dysuria, he does report that he has had some "boils" in the groin previously, thinks that he may be having folliculitis versus boils again.  He just wants to make sure about any possible sexually transmitted infection.  He denies previous history of cold sores, versus herpes labialis.  HPI     Home Medications Prior to Admission medications   Medication Sig Start Date End Date Taking? Authorizing Provider  doxycycline (VIBRAMYCIN) 100 MG capsule Take 1 capsule (100 mg total) by mouth 2 (two) times daily. 11/04/21   Pollyann Savoy, MD      Allergies    Bactrim [sulfamethoxazole-trimethoprim] and Other    Review of Systems   Review of Systems  All other systems reviewed and are negative.   Physical Exam Updated Vital Signs BP 98/70 (BP Location: Left Arm)   Pulse 77   Temp 97.8 F (36.6 C) (Oral)   Resp 16   Ht 6' (1.829 m)   Wt 79.4 kg   SpO2 99%   BMI 23.73 kg/m  Physical Exam Vitals and nursing note reviewed.  Constitutional:      General: He is not in acute distress.    Appearance: Normal appearance.  HENT:     Head: Normocephalic and atraumatic.  Eyes:     General:        Right eye: No discharge.        Left eye: No discharge.  Cardiovascular:     Rate and Rhythm: Normal rate and regular rhythm.  Pulmonary:     Effort: Pulmonary effort is normal. No respiratory distress.  Genitourinary:    Comments: Patient with normal appearance of penis, scrotum, no inguinal  lymphadenopathy.  He has an shallow ulcerated lesion on the upper pubic mound which is consistent with possible irritated or first for colitis bump that has been eroded secondary to friction from belt buckle.  No other surrounding vesicular lesions noted. Musculoskeletal:        General: No deformity.  Skin:    General: Skin is warm and dry.     Comments: Scattered folliculitis bumps in beard hair  Neurological:     Mental Status: He is alert and oriented to person, place, and time.  Psychiatric:        Mood and Affect: Mood normal.        Behavior: Behavior normal.     ED Results / Procedures / Treatments   Labs (all labs ordered are listed, but only abnormal results are displayed) Labs Reviewed  HIV ANTIBODY (ROUTINE TESTING W REFLEX)  RPR  HSV 1 ANTIBODY, IGG  HSV 2 ANTIBODY, IGG  HSV 1/2 PCR (SURFACE)  GC/CHLAMYDIA PROBE AMP ([place]) NOT AT [facility]    EKG None  Radiology No results found.  Procedures Procedures    Medications Ordered in ED Medications - No data to display  ED Course/ Medical Decision Making/ A&P  Medical Decision Making Amount and/or Complexity of Data Reviewed Labs: ordered.   This patient is a 47 y.o. male who presents to the ED for concern of irritated bumps on face, as well as has suprapubically where his pants rub.  Patient reports that he has had some unprotected sex and is concerned for possible sexually transmitted infection.  He denies any dysuria, penile discharge..   Differential diagnoses prior to evaluation: Herpes, syphilis, folliculitis, versus other ulcer, boil, cellulitis, versus other, overall low clinical suspicion for GC, chlamydia, UTI based on his presentation  Past Medical History / Social History / Additional history: Chart reviewed. Pertinent results include: Overall noncontributory  Physical Exam: Physical exam performed. The pertinent findings include: Patient with normal appearance  of penis, scrotum, no inguinal lymphadenopathy.  He has an shallow ulcerated lesion on the upper pubic mound which is consistent with possible irritated or first for colitis bump that has been eroded secondary to friction from belt buckle.  No other surrounding vesicular lesions noted.   Scattered folliculitis bumps in beard hair   Medications / Treatment: Overall symptoms are consistent with cellulitis, encouraged covering his bump on the suprapubic region with a bandage so that it is protected from his belt buckle, and using benzyl peroxide in beard and groin to help with folliculitis, he will follow-up on his STI testing, PCR swab obtained from ulcer, blood tests for syphilis, HIV, as well as HSV obtained   Disposition: After consideration of the diagnostic results and the patients response to treatment, I feel that patient is stable for discharge, encouraged to follow-up as discussed above.   emergency department workup does not suggest an emergent condition requiring admission or immediate intervention beyond what has been performed at this time. The plan is: as above. The patient is safe for discharge and has been instructed to return immediately for worsening symptoms, change in symptoms or any other concerns.  Final Clinical Impression(s) / ED Diagnoses Final diagnoses:  Folliculitis  Ulcer of abdomen wall, limited to breakdown of skin [facility])  Concern about STD in male without diagnosis    Rx / DC Orders ED Discharge Orders     None         [place] 02/05/23 1546    Rondel Baton, MD 02/05/23 2352

## 2023-02-05 NOTE — ED Triage Notes (Signed)
States was breaking out on his face and now has a lump on his lower abdomen where pants rub. Concerned for STD. Denies penile discharge/urinary symptoms.

## 2023-02-06 LAB — GC/CHLAMYDIA PROBE AMP (~~LOC~~) NOT AT ARMC
Chlamydia: NEGATIVE
Comment: NEGATIVE
Comment: NORMAL
Neisseria Gonorrhea: NEGATIVE

## 2023-02-06 LAB — HSV 2 ANTIBODY, IGG: HSV 2 Glycoprotein G Ab, IgG: <0.91 index (ref 0.00–0.90)

## 2023-02-06 LAB — RPR: RPR Ser Ql: NONREACTIVE

## 2023-02-06 LAB — HSV 1 ANTIBODY, IGG: HSV 1 Glycoprotein G Ab, IgG: <0.91 index (ref 0.00–0.90)

## 2023-02-07 LAB — HSV 1/2 PCR (SURFACE)
HSV-1 DNA: NOT DETECTED
HSV-2 DNA: NOT DETECTED

## 2023-04-27 ENCOUNTER — Encounter (HOSPITAL_BASED_OUTPATIENT_CLINIC_OR_DEPARTMENT_OTHER): Payer: Self-pay | Admitting: Emergency Medicine

## 2023-04-27 ENCOUNTER — Other Ambulatory Visit: Payer: Self-pay

## 2023-04-27 DIAGNOSIS — M25551 Pain in right hip: Secondary | ICD-10-CM | POA: Insufficient documentation

## 2023-04-27 NOTE — ED Triage Notes (Signed)
Patient c/o right sided hip pain that occasionally radiates down leg. Patient states he has struggled with this type of pain for years but it comes in flare ups. Denies numbness, tingling or urinary sx. Ambulatory to triage but states pain is worse with ambulation and certain movements.

## 2023-04-28 ENCOUNTER — Emergency Department (HOSPITAL_BASED_OUTPATIENT_CLINIC_OR_DEPARTMENT_OTHER)
Admission: EM | Admit: 2023-04-28 | Discharge: 2023-04-28 | Disposition: A | Discharge status: Home / Self Care | Payer: Medicaid Other | Attending: Emergency Medicine | Admitting: Emergency Medicine

## 2023-04-28 ENCOUNTER — Emergency Department (HOSPITAL_BASED_OUTPATIENT_CLINIC_OR_DEPARTMENT_OTHER): Payer: Medicaid Other

## 2023-04-28 DIAGNOSIS — M25551 Pain in right hip: Secondary | ICD-10-CM

## 2023-04-28 MED ORDER — MELOXICAM 15 MG PO TABS: 15.0000 mg | ORAL_TABLET | Freq: Every day | ORAL | 0 refills | Status: AC

## 2023-04-28 MED ORDER — DICLOFENAC SODIUM 1 % EX GEL: 2.0000 g | Freq: Four times a day (QID) | CUTANEOUS | 0 refills | Status: AC | PRN

## 2023-04-28 MED ORDER — KETOROLAC TROMETHAMINE 30 MG/ML IJ SOLN
30.0000 mg | Freq: Once | INTRAMUSCULAR | Status: AC
  Administered 2023-04-28: 30 mg via INTRAMUSCULAR
  Filled 2023-04-28: qty 1

## 2023-04-28 NOTE — ED Provider Notes (Signed)
Emergency Department Provider Note   I have reviewed the triage vital signs and the nursing notes.   HISTORY  Chief Complaint Hip Pain   HPI Jeffrey Cherry is a 47 y.o. male presents to the emergency department with a right sided hip pain radiating into the right thigh.  He states he has had this pain on and off for several years but it is worse in the past several days.  He works at [employer] with a fairly physical job.  Denies known injury but feels like this pain is flaring.  No low back or pain radiating down the posterior leg.  No numbness or weakness.  No urinary symptoms.  Pain is worse with ambulation and certain movements.   History reviewed. No pertinent past medical history.  Review of Systems  Constitutional: No fever/chills Cardiovascular: Denies chest pain. Respiratory: Denies shortness of breath. Gastrointestinal: No abdominal pain.  Genitourinary: Negative for dysuria. Musculoskeletal: Positive right hip/thigh pain.  Skin: Negative for rash. Neurological: Negative for headaches.  ____________________________________________   PHYSICAL EXAM:  VITAL SIGNS: ED Triage Vitals  Encounter Vitals Group     BP 04/27/23 2323 112/77     Pulse Rate 04/27/23 2323 65     Resp 04/27/23 2323 18     Temp 04/27/23 2323 98 F (36.7 C)     Temp Source 04/27/23 2323 Oral     SpO2 04/27/23 2323 100 %     Weight 04/27/23 2330 175 lb (79.4 kg)     Height 04/27/23 2330 6' (1.829 m)   Constitutional: Alert and oriented. Well appearing and in no acute distress. Eyes: Conjunctivae are normal. Head: Atraumatic. Nose: No congestion/rhinnorhea. Mouth/Throat: Mucous membranes are moist.  Neck: No stridor.   Cardiovascular: Normal rate, regular rhythm. Good peripheral circulation. Grossly normal heart sounds.   Respiratory: Normal respiratory effort.  No retractions. Lungs CTAB. Gastrointestinal: Soft and nontender. No distention.  Musculoskeletal: No lower extremity  tenderness nor edema. No gross deformities of extremities. Neurologic:  Normal speech and language. No gross focal neurologic deficits are appreciated.  Skin:  Skin is warm, dry and intact. No rash noted.  ____________________________________________   PROCEDURES  Procedure(s) performed:   Procedures  None  ____________________________________________   INITIAL IMPRESSION / ASSESSMENT AND PLAN / ED COURSE  Pertinent labs & imaging results that were available during my care of the patient were reviewed by me and considered in my medical decision making (see chart for details).   This patient is Presenting for Evaluation of hip pain, which does require a range of treatment options, and is a complaint that involves a moderate risk of morbidity and mortality.  The Differential Diagnoses include MSK strain, sciatica, DVT, fracture, dislocation, etc.  Critical Interventions-    Medications  ketorolac (TORADOL) 30 MG/ML injection 30 mg (30 mg Intramuscular Given 04/28/23 0058)    Reassessment after intervention: pain improved.   Radiologic Tests Ordered, included hip XR. I independently interpreted the images and agree with radiology interpretation.   Medical Decision Making: Summary:  Patient presents emergency room with right hip pain.  Normal range of motion.  X-ray shows no fracture or dislocation.  Plan for anti-inflammatory medication and Ortho/sports medicine follow-up.  Patient's presentation is most consistent with acute, uncomplicated illness.   Disposition: discharge  ____________________________________________  FINAL CLINICAL IMPRESSION(S) / ED DIAGNOSES  Final diagnoses:  Right hip pain    NEW OUTPATIENT MEDICATIONS STARTED DURING THIS VISIT:  Discharge Medication List as of 04/28/2023  2:16 AM  START taking these medications   Details  diclofenac Sodium (VOLTAREN) 1 % GEL Apply 2 g topically 4 (four) times daily as needed., Starting Sat 04/28/2023,  Normal    meloxicam (MOBIC) 15 MG tablet Take 1 tablet (15 mg total) by mouth daily for 7 days., Starting Sat 04/28/2023, Until Sat 05/05/2023, Normal        Note:  This document was prepared using Dragon voice recognition software and may include unintentional dictation errors.  Alona Bene, MD, [facility] Emergency Medicine    Derreon Consalvo, Arlyss Repress, MD 04/29/23 [phone]

## 2023-04-28 NOTE — Discharge Instructions (Signed)
We believe that your symptoms are caused by musculoskeletal strain.  Please read through the included information about additional care such as heating pads, over-the-counter pain medicine.  If you were provided a prescription please use it only as needed and as instructed.  Remember that early mobility and using the affected part of your body is actually better than keeping it immobile.  Follow-up with the doctor listed as recommended or return to the emergency department with new or worsening symptoms that concern you.
